# Patient Record
Sex: Female | Born: 1990 | Race: White | Hispanic: No | Marital: Single | Smoking: Current every day smoker
Health system: Southern US, Community
[De-identification: ages and names within clinical notes are randomized; demographics above are authoritative.]

## PROBLEM LIST (undated history)

## (undated) DIAGNOSIS — M329 Systemic lupus erythematosus, unspecified: Secondary | ICD-10-CM

## (undated) DIAGNOSIS — IMO0002 Reserved for concepts with insufficient information to code with codable children: Secondary | ICD-10-CM

## (undated) HISTORY — PX: CHOLECYSTECTOMY: SHX55

---

## 2015-03-07 ENCOUNTER — Encounter (HOSPITAL_COMMUNITY): Payer: Self-pay | Admitting: Emergency Medicine

## 2015-03-07 ENCOUNTER — Inpatient Hospital Stay (HOSPITAL_COMMUNITY)
Admission: EM | Admit: 2015-03-07 | Discharge: 2015-03-12 | DRG: 123 | Disposition: A | Discharge status: Home / Self Care | Payer: Self-pay | Attending: Internal Medicine | Admitting: Internal Medicine

## 2015-03-07 ENCOUNTER — Emergency Department (HOSPITAL_COMMUNITY): Payer: Self-pay

## 2015-03-07 DIAGNOSIS — H5441 Blindness, right eye, normal vision left eye: Secondary | ICD-10-CM | POA: Diagnosis present

## 2015-03-07 DIAGNOSIS — H469 Unspecified optic neuritis: Principal | ICD-10-CM | POA: Diagnosis present

## 2015-03-07 DIAGNOSIS — F17200 Nicotine dependence, unspecified, uncomplicated: Secondary | ICD-10-CM | POA: Diagnosis present

## 2015-03-07 DIAGNOSIS — M62838 Other muscle spasm: Secondary | ICD-10-CM | POA: Diagnosis present

## 2015-03-07 DIAGNOSIS — G43709 Chronic migraine without aura, not intractable, without status migrainosus: Secondary | ICD-10-CM | POA: Diagnosis present

## 2015-03-07 DIAGNOSIS — D869 Sarcoidosis, unspecified: Secondary | ICD-10-CM | POA: Diagnosis present

## 2015-03-07 DIAGNOSIS — H4710 Unspecified papilledema: Secondary | ICD-10-CM | POA: Diagnosis present

## 2015-03-07 DIAGNOSIS — H5461 Unqualified visual loss, right eye, normal vision left eye: Secondary | ICD-10-CM

## 2015-03-07 DIAGNOSIS — Z720 Tobacco use: Secondary | ICD-10-CM

## 2015-03-07 DIAGNOSIS — G43719 Chronic migraine without aura, intractable, without status migrainosus: Secondary | ICD-10-CM | POA: Insufficient documentation

## 2015-03-07 LAB — CBC
HEMATOCRIT: 48.9 % — AB (ref 36.0–46.0)
HEMOGLOBIN: 16 g/dL — AB (ref 12.0–15.0)
MCH: 30.9 pg (ref 26.0–34.0)
MCHC: 32.7 g/dL (ref 30.0–36.0)
MCV: 94.6 fL (ref 78.0–100.0)
PLATELETS: 179 10*3/uL (ref 150–400)
RBC: 5.17 MIL/uL — AB (ref 3.87–5.11)
RDW: 13 % (ref 11.5–15.5)
WBC: 6.8 10*3/uL (ref 4.0–10.5)

## 2015-03-07 LAB — BASIC METABOLIC PANEL
ANION GAP: 9 (ref 5–15)
BUN: 13 mg/dL (ref 6–20)
CO2: 22 mmol/L (ref 22–32)
Calcium: 9.3 mg/dL (ref 8.9–10.3)
Chloride: 107 mmol/L (ref 101–111)
Creatinine, Ser: 0.88 mg/dL (ref 0.44–1.00)
GLUCOSE: 102 mg/dL — AB (ref 65–99)
POTASSIUM: 4.3 mmol/L (ref 3.5–5.1)
Sodium: 138 mmol/L (ref 135–145)

## 2015-03-07 LAB — CREATININE, SERUM
Creatinine, Ser: 1.06 mg/dL — ABNORMAL HIGH (ref 0.44–1.00)
GFR calc non Af Amer: >60 mL/min (ref >60)

## 2015-03-07 LAB — CBC WITH DIFFERENTIAL/PLATELET
BASOS ABS: 0 10*3/uL (ref 0.0–0.1)
Basophils Relative: 0 %
Eosinophils Absolute: 0.2 10*3/uL (ref 0.0–0.7)
Eosinophils Relative: 2 %
HEMATOCRIT: 48.8 % — AB (ref 36.0–46.0)
HEMOGLOBIN: 16.4 g/dL — AB (ref 12.0–15.0)
LYMPHS PCT: 13 %
Lymphs Abs: 1.2 10*3/uL (ref 0.7–4.0)
MCH: 31.6 pg (ref 26.0–34.0)
MCHC: 33.6 g/dL (ref 30.0–36.0)
MCV: 94 fL (ref 78.0–100.0)
MONO ABS: 0.6 10*3/uL (ref 0.1–1.0)
MONOS PCT: 7 %
NEUTROS ABS: 7.3 10*3/uL (ref 1.7–7.7)
NEUTROS PCT: 78 %
Platelets: 249 10*3/uL (ref 150–400)
RBC: 5.19 MIL/uL — ABNORMAL HIGH (ref 3.87–5.11)
RDW: 13.3 % (ref 11.5–15.5)
WBC: 9.3 10*3/uL (ref 4.0–10.5)

## 2015-03-07 LAB — SEDIMENTATION RATE: Sed Rate: 4 mm/hr (ref 0–22)

## 2015-03-07 MED ORDER — SIMETHICONE 80 MG PO CHEW
80.0000 mg | CHEWABLE_TABLET | Freq: Four times a day (QID) | ORAL | Status: DC | PRN
  Administered 2015-03-07 – 2015-03-11 (×2): 80 mg via ORAL
  Filled 2015-03-07 (×2): qty 1

## 2015-03-07 MED ORDER — ENOXAPARIN SODIUM 40 MG/0.4ML ~~LOC~~ SOLN
40.0000 mg | SUBCUTANEOUS | Status: DC
  Administered 2015-03-07 – 2015-03-11 (×5): 40 mg via SUBCUTANEOUS
  Filled 2015-03-07 (×5): qty 0.4

## 2015-03-07 MED ORDER — SODIUM CHLORIDE 0.9 % IV SOLN
250.0000 mg | Freq: Four times a day (QID) | INTRAVENOUS | Status: AC
  Administered 2015-03-07 – 2015-03-12 (×20): 250 mg via INTRAVENOUS
  Filled 2015-03-07 (×24): qty 2

## 2015-03-07 MED ORDER — NICOTINE 21 MG/24HR TD PT24
21.0000 mg | MEDICATED_PATCH | Freq: Every day | TRANSDERMAL | Status: DC
  Administered 2015-03-07 – 2015-03-12 (×6): 21 mg via TRANSDERMAL
  Filled 2015-03-07 (×6): qty 1

## 2015-03-07 MED ORDER — LORAZEPAM 2 MG/ML IJ SOLN
1.0000 mg | Freq: Once | INTRAMUSCULAR | Status: AC
  Administered 2015-03-07: 1 mg via INTRAVENOUS
  Filled 2015-03-07: qty 1

## 2015-03-07 MED ORDER — OXYCODONE-ACETAMINOPHEN 5-325 MG PO TABS
1.0000 | ORAL_TABLET | Freq: Four times a day (QID) | ORAL | Status: DC | PRN
  Administered 2015-03-07 – 2015-03-09 (×5): 1 via ORAL
  Filled 2015-03-07 (×5): qty 1

## 2015-03-07 NOTE — Progress Notes (Signed)
Patient admitted to [facility] room 16 from [facility]  Admission made aware. Patient alert and oriented X4  Patient oriented to room and unit.

## 2015-03-07 NOTE — Consult Note (Addendum)
Ophthalmology Initial Consult Note  Jill, Ryan, 24 y.o. female Date of Service:  03/07/2015  Requesting physician: Gerhard Munch, MD  Information Obtained from: patient Chief Complaint:  Acute onset painless vision loss OD  HPI/Discussion:  Jill Ryan is a 24 y.o. female who presents with a 5-day history of painless progressive vision loss OD. It began with central blurriness, but then gradually the area of blurriness got progressively larger to the point where she was only able to detect motion. She has no pain at baseline, but she feels discomfort behind the eye (OD) when she moves it side to side. Unsure whether she had flashes/floaters/curtains. Incidentally, she has a history of prior peritoneal aneurysm. No complaints OS.  ROS(+): pain with eye movement laterally behind eye ROS(-): headache, neck pain,    Past Ocular Hx:  Denies Ocular Meds:  None Family ocular history: None  History reviewed. No pertinent past medical history. Past Surgical History  Procedure Laterality Date  . Cholecystectomy      Prior to Admission Meds:  (Not in a hospital admission)  Inpatient Meds: NA  No Known Allergies Social History  Substance Use Topics  . Smoking status: Current Every Day Smoker  . Smokeless tobacco: Not on file  . Alcohol Use: Yes   No family history on file.  ROS: Other than ROS in the HPI, all other systems were negative.  Exam: Temp: 98.2 F (36.8 C) Pulse Rate: 77 BP: 101/67 mmHg Resp: 18 SpO2: 99 %  Visual Acuity:  [name] (near)   OD HM 1 foot   OS 20/20     OD OS  Confr Vis Fields Grossly restricted Full  EOM (Primary) Full, no pain Full  Lids/Lashes WNL WNL  Conjunctiva - Bulbar White, quiet White, quiet  Conjunctiva - Palpebral               White, quiet White, quiet  Adnexa  WNL WNL  Pupils  4 --> 2, large rAPD (very sluggish by direct, brisk by consensual) 4 --> 2, no rAPD  Cornea  Clear Clear  Anterior Chamber Formed Formed   Lens:  Clear Clear  IOP 18 15  Fundus - Dilated? Yes   Optic Disc - C:D Ratio Small cup, difficult to determine 0.2                     Appearance  Significant disc edema with blurred margins 360 degrees, vessels still visible Pink, no edema  Post Seg:  Retina                    Vessels WNL WNL                  Vitreous  Clear Clear                  Macula Good foveal reflex Good foveal reflex                  Periphery WNL WNL       Neuro:  Oriented to person, place, and time:  Yes Psychiatric:  Mood and Affect Appropriate:  Yes  Labs/imaging:  MRI without contrast brain: WNL   A/P:  24 y.o. female with optic neuritis right eye  1) Optic neuritis, OD - Need to repeat MRI brain/orbits with contrast to r/o white matter disease. - Recommend neurology consult ASAP for possible admission and high-dose steroids. - Less likely would be neuroretinitis, as there is a good foveal  reflex and no macular edema or star.  Will reevaluate the patient in a few days if she is still admitted. Otherwise please have her f/u in clinic upon discharge for full repeat eye exam with visual field testing.  Baldwin [place] Scott Groat, MD 03/07/2015, 2:42 PM [facility] [facility] [address] [place], [place] [id number]

## 2015-03-07 NOTE — Progress Notes (Signed)
[facility] hospitalist progress note. Chief complaint. Transfer note. History of present illness. This 24 year old female [facility] complaining of blindness of the right eye. MRI of the brain found no acute changes. Ophthalmology consult. IV emergency room felt this was optic neuritis. [facility] for high-dose steroids and neurology consult. She is now arrived in transfer and I'm seeing her at bedside to ensure she remains clinically stable and that her orders have transferred here probably. Physical exam. Vital signs. Temperature 97.7, pulse 81, respiration 18, blood pressure 126/70. O2 sats 97. General appearance. Well-developed female who is alert and in no distress. Cardiac. Rate and rhythm regular. Lungs. Breath sounds clear and equal. Abdomen. Soft with positive bowel sounds. Neurologic. Other than left eye blindness no unilateral or focal defects noted. Impression/plan. Problem #1. Optic neuritis. Patient started on steroids in the emergency room. I will notify neurology of patient's arrival. Problem #2. Tobacco abuse. Smoking cessation with nicotine patch. Patient appears clinically stable post transfer. All orders appear to of transferred appropriately.

## 2015-03-07 NOTE — ED Notes (Signed)
Per pt, states she lost vision in right eye about 5 days ago-states symptoms have gotten worse-states she thought it was a cataract-states it makes he feel disoriented-states headache as well-pain behind right eye

## 2015-03-07 NOTE — ED Notes (Addendum)
Awake. Verbally responsive. A/O x4. Resp even and unlabored. No audible adventitious breath sounds noted. ABC's intact. Pt reported to continue to have visual disturbances to rt eye nausea, and tingling to nose. IV saline lock patent and intact. Family at bedside.

## 2015-03-07 NOTE — Consult Note (Signed)
NEURO HOSPITALIST CONSULT NOTE   Referring physician: [facility] Reason for Consult: new onset right vision loss  HPI:                                                                                                                                          Jill Ryan is an 24 y.o. female without relevant past medical history, transfer to [facility] or further evaluation of new onset right vision loss. Patient initially presented to [facility] with complains of acute, progressive visual loss right eye over the past 5 days and was evaluated by opthalmology. Never had similar symptom before. She tells me that she woke up one day with impaired vision in her right eye and some discomfort behind the right eye that becomes more ostensible when she moves her eyes. Stated that she is seeing " stuff that move nearby my right eye". Similarly, she reports a HA for the last couple of days but denies vertigo, double vision, focal weakness or numbness, unsteadiness, slurred speech, bladder or bowel dysfunction, or language impairment. No recent fever, infection, or skin rash. No visual trauma or family history of vision loss. MRI brain without contrast was personally reviewed and showed no acute abnormality.   History reviewed. No pertinent past medical history.  Past Surgical History  Procedure Laterality Date  . Cholecystectomy      History reviewed. No pertinent family history.  Family History: no hereditary neurophthalmological disorders, MS, brain tumors, or brain aneurysm   Social History:  reports that she has been smoking.  She does not have any smokeless tobacco history on file. She reports that she drinks alcohol. Her drug history is not on file.  No Known Allergies  MEDICATIONS:                                                                                                                     Scheduled: . enoxaparin (LOVENOX) injection  40 mg Subcutaneous Q24H  .  methylPREDNISolone (SOLU-MEDROL) injection  250 mg Intravenous 4 times per day  . nicotine  21 mg Transdermal Daily     ROS:  History obtained from chart review and the patient  General ROS: negative for - chills, fatigue, fever, night sweats, weight gain or weight loss Psychological ROS: negative for - behavioral disorder, hallucinations, memory difficulties, mood swings or suicidal ideation Ophthalmic ROS: negative for - blurry vision or double vision ENT ROS: negative for - epistaxis, nasal discharge, oral lesions, sore throat, tinnitus or vertigo Allergy and Immunology ROS: negative for - hives or itchy/watery eyes Hematological and Lymphatic ROS: negative for - bleeding problems, bruising or swollen lymph nodes Endocrine ROS: negative for - galactorrhea, hair pattern changes, polydipsia/polyuria or temperature intolerance Respiratory ROS: negative for - cough, hemoptysis, shortness of breath or wheezing Cardiovascular ROS: negative for - chest pain, dyspnea on exertion, edema or irregular heartbeat Gastrointestinal ROS: negative for - abdominal pain, diarrhea, hematemesis, nausea/vomiting or stool incontinence Genito-Urinary ROS: negative for - dysuria, hematuria, incontinence or urinary frequency/urgency Musculoskeletal ROS: negative for - joint swelling or muscular weakness Neurological ROS: as noted in HPI Dermatological ROS: negative for rash and skin lesion changes   Physical exam:  Constitutional: well developed, pleasant female in no apparent distress. Blood pressure 118/76, pulse 80, temperature 98 F (36.7 C), temperature source Oral, resp. rate 18, height '5\' 1"'  (1.549 m), weight 87.635 kg (193 lb 3.2 oz), SpO2 96 %. Eyes: no jaundice or exophthalmos.  Head: normocephalic. Neck: supple, no bruits, no JVD. Cardiac: no murmurs. Lungs:  clear. Abdomen: soft, no tender, no mass. Extremities: no edema, clubbing, or cyanosis.  Skin: no rash  Neurologic Examination:                                                                                                      General: NAD Mental Status: Alert, oriented, thought content appropriate.  Speech fluent without evidence of aphasia.  Able to follow 3 step commands without difficulty. Cranial Nerves: II: Discs flat bilaterally; Visual fields and visual acuity grossly restricted right eye, pupils equal, round, reactive to light but with a right APD. III,IV, VI: ptosis not present, extra-ocular motions intact bilaterally V,VII: smile symmetric, facial light touch sensation normal bilaterally VIII: hearing normal bilaterally IX,X: uvula rises symmetrically XI: bilateral shoulder shrug XII: midline tongue extension without atrophy or fasciculations  Motor: Right : Upper extremity   5/5    Left:     Upper extremity   5/5  Lower extremity   5/5     Lower extremity   5/5 Tone and bulk:normal tone throughout; no atrophy noted Sensory: Pinprick and light touch intact throughout, bilaterally Deep Tendon Reflexes:  Right: Upper Extremity   Left: Upper extremity   biceps (C-5 to C-6) 2/4   biceps (C-5 to C-6) 2/4 tricep (C7) 2/4    triceps (C7) 2/4 Brachioradialis (C6) 2/4  Brachioradialis (C6) 2/4  Lower Extremity Lower Extremity  quadriceps (L-2 to L-4) 2/4   quadriceps (L-2 to L-4) 2/4 Achilles (S1) 2/4   Achilles (S1) 2/4  Plantars: Right: downgoing   Left: downgoing Cerebellar: normal finger-to-nose,  normal heel-to-shin test Gait:  No ataxia    No results found for: CHOL  Results for orders placed or performed during the hospital encounter of 03/07/15 (from the past 48 hour(s))  Basic metabolic panel     Status: Abnormal   Collection Time: 03/07/15 11:08 AM  Result Value Ref Range   Sodium 138 135 - 145 mmol/L   Potassium 4.3 3.5 - 5.1 mmol/L   Chloride 107 101  - 111 mmol/L   CO2 22 22 - 32 mmol/L   Glucose, Bld 102 (H) 65 - 99 mg/dL   BUN 13 6 - 20 mg/dL   Creatinine, Ser 0.88 0.44 - 1.00 mg/dL   Calcium 9.3 8.9 - 10.3 mg/dL   GFR calc non Af Amer >60 >60 mL/min   GFR calc Af Amer >60 >60 mL/min    Comment: (NOTE) The eGFR has been calculated using the CKD EPI equation. This calculation has not been validated in all clinical situations. eGFR's persistently <60 mL/min signify possible Chronic Kidney Disease.    Anion gap 9 5 - 15  CBC with Differential     Status: Abnormal   Collection Time: 03/07/15 11:08 AM  Result Value Ref Range   WBC 9.3 4.0 - 10.5 K/uL   RBC 5.19 (H) 3.87 - 5.11 MIL/uL   Hemoglobin 16.4 (H) 12.0 - 15.0 g/dL   HCT 48.8 (H) 36.0 - 46.0 %   MCV 94.0 78.0 - 100.0 fL   MCH 31.6 26.0 - 34.0 pg   MCHC 33.6 30.0 - 36.0 g/dL   RDW 13.3 11.5 - 15.5 %   Platelets 249 150 - 400 K/uL   Neutrophils Relative % 78 %   Neutro Abs 7.3 1.7 - 7.7 K/uL   Lymphocytes Relative 13 %   Lymphs Abs 1.2 0.7 - 4.0 K/uL   Monocytes Relative 7 %   Monocytes Absolute 0.6 0.1 - 1.0 K/uL   Eosinophils Relative 2 %   Eosinophils Absolute 0.2 0.0 - 0.7 K/uL   Basophils Relative 0 %   Basophils Absolute 0.0 0.0 - 0.1 K/uL  Sedimentation rate     Status: None   Collection Time: 03/07/15 11:08 AM  Result Value Ref Range   Sed Rate 4 0 - 22 mm/hr  CBC     Status: Abnormal   Collection Time: 03/07/15  9:18 PM  Result Value Ref Range   WBC 6.8 4.0 - 10.5 K/uL   RBC 5.17 (H) 3.87 - 5.11 MIL/uL   Hemoglobin 16.0 (H) 12.0 - 15.0 g/dL   HCT 48.9 (H) 36.0 - 46.0 %   MCV 94.6 78.0 - 100.0 fL   MCH 30.9 26.0 - 34.0 pg   MCHC 32.7 30.0 - 36.0 g/dL   RDW 13.0 11.5 - 15.5 %   Platelets 179 150 - 400 K/uL  Creatinine, serum     Status: Abnormal   Collection Time: 03/07/15  9:18 PM  Result Value Ref Range   Creatinine, Ser 1.06 (H) 0.44 - 1.00 mg/dL   GFR calc non Af Amer >60 >60 mL/min   GFR calc Af Amer >60 >60 mL/min    Comment: (NOTE) The  eGFR has been calculated using the CKD EPI equation. This calculation has not been validated in all clinical situations. eGFR's persistently <60 mL/min signify possible Chronic Kidney Disease.     Mr Jodene Nam Head Wo Contrast  03/07/2015  CLINICAL DATA:  Acute RIGHT eye visual loss. This began 5 days ago. Headache behind RIGHT eye. EXAM: MRI HEAD WITHOUT CONTRAST MRA HEAD WITHOUT CONTRAST TECHNIQUE: Multiplanar, multiecho pulse sequences of the brain  and surrounding structures were obtained without intravenous contrast. Angiographic images of the head were obtained using MRA technique without contrast. COMPARISON:  None. FINDINGS: MRI HEAD FINDINGS No evidence for acute infarction, hemorrhage, mass lesion, hydrocephalus, or extra-axial fluid. There is no atrophy or white matter disease. Pineal and cerebellar tonsils unremarkable. No upper cervical lesions. Normal pituitary with no evidence for chiasmatic compression or suprasellar mass. Flow voids are maintained throughout the carotid, basilar, and vertebral arteries. There are no areas of chronic hemorrhage. Visualized calvarium, skull base, and upper cervical osseous structures unremarkable. Scalp and extracranial soft tissues, sinuses, and mastoids show no acute process. Negative appearing orbits. Symmetric globes with no retrobulbar lesions observed. MRA HEAD FINDINGS The internal carotid arteries are widely patent. The basilar artery is widely patent with vertebrals codominant. No intracranial stenosis or aneurysm. Patency of the BILATERAL ophthalmic arteries is established on axial source images. IMPRESSION: Negative noncontrast MRI of the brain and MRA intracranial. Electronically Signed   By: Staci Righter M.D.   On: 03/07/2015 12:10   [name] Contrast  03/07/2015  CLINICAL DATA:  Acute RIGHT eye visual loss. This began 5 days ago. Headache behind RIGHT eye. EXAM: MRI HEAD WITHOUT CONTRAST MRA HEAD WITHOUT CONTRAST TECHNIQUE: Multiplanar, multiecho  pulse sequences of the brain and surrounding structures were obtained without intravenous contrast. Angiographic images of the head were obtained using MRA technique without contrast. COMPARISON:  None. FINDINGS: MRI HEAD FINDINGS No evidence for acute infarction, hemorrhage, mass lesion, hydrocephalus, or extra-axial fluid. There is no atrophy or white matter disease. Pineal and cerebellar tonsils unremarkable. No upper cervical lesions. Normal pituitary with no evidence for chiasmatic compression or suprasellar mass. Flow voids are maintained throughout the carotid, basilar, and vertebral arteries. There are no areas of chronic hemorrhage. Visualized calvarium, skull base, and upper cervical osseous structures unremarkable. Scalp and extracranial soft tissues, sinuses, and mastoids show no acute process. Negative appearing orbits. Symmetric globes with no retrobulbar lesions observed. MRA HEAD FINDINGS The internal carotid arteries are widely patent. The basilar artery is widely patent with vertebrals codominant. No intracranial stenosis or aneurysm. Patency of the BILATERAL ophthalmic arteries is established on axial source images. IMPRESSION: Negative noncontrast MRI of the brain and MRA intracranial. Electronically Signed   By: Staci Righter M.D.   On: 03/07/2015 12:10   Assessment/Plan: 24 y/o otherwise healthy, admitted with likely right optic neuritis and less possible neuroretinitis.  MRI brain unrevealing but it was performed without contrast. Ordered MRI brain and orbits with and without contrast. Trial of high dose IV methylprednisolone. Check ACE level, ANA, Lyme titers, ESR, C reactive protein, HIV, B12. Will follow up.   Dorian Pod, MD 03/07/2015, 11:54 PM  [facility]

## 2015-03-07 NOTE — ED Notes (Signed)
Awake. Verbally responsive. A/O x4. Resp even and unlabored. No audible adventitious breath sounds noted. ABC's intact. IV saline lock patent and intact. 

## 2015-03-07 NOTE — ED Notes (Signed)
Patient returned from MRI without distress noted. 

## 2015-03-07 NOTE — ED Provider Notes (Signed)
CSN: [id number]     Arrival date & time 03/07/15  1005 History   First MD Initiated Contact with Patient 03/07/15 1020     Chief Complaint  Patient presents with  . vision changes      (Consider location/radiation/quality/duration/timing/severity/associated sxs/prior Treatment) HPI Patient presents with 5 days of admission loss. Symptoms were initially mild, with focal loss of vision in the central field of the right eye. Over the interval 5 days, the patient has developed a loss of vision in the right eye. She can now only see motion, changes in light pattern, with no preserved accuracy. There is no associated headache, neck pain, fever, chills, nausea, vomiting. 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 Since onset symptoms have been progressive, with no clear alleviating or exacerbating factors. History reviewed. No pertinent past medical history. Past Surgical History  Procedure Laterality Date  . Cholecystectomy     No family history on file. Social History  Substance Use Topics  . Smoking status: Current Every Day Smoker  . Smokeless tobacco: None  . Alcohol Use: Yes   OB History    No data available     Review of Systems  Constitutional:       Per HPI, otherwise negative  HENT:       Per HPI, otherwise negative  Eyes: Positive for visual disturbance. Negative for photophobia, pain, discharge, redness and itching.  Respiratory:       Per HPI, otherwise negative  Cardiovascular:       Per HPI, otherwise negative  Gastrointestinal: Negative for vomiting.  Endocrine:       Negative aside from HPI  Genitourinary:       Neg aside from HPI   Musculoskeletal:       Per HPI, otherwise negative  Skin: Negative.   Neurological: Negative for syncope.      Allergies  Review of patient's allergies indicates not on file.  Home Medications   Prior to Admission medications   Not on File   BP 135/98 mmHg   Pulse 94  Temp(Src) 98.1 F (36.7 C) (Oral)  Resp 18  SpO2 98% Physical Exam  Constitutional: She is oriented to person, place, and time. She appears well-developed and well-nourished. No distress.  HENT:  Head: Normocephalic and atraumatic.  Eyes: Conjunctivae and EOM are normal. Right eye exhibits no chemosis, no discharge and no exudate. No foreign body present in the right eye. Left eye exhibits no chemosis, no discharge and no exudate. No foreign body present in the left eye.  R eye afferent defect w slower and less pronounced constriction with light. Patient can only appreciate light differentiation, gross motion from the right eye. Formal eye testing demonstrates no capacity to see eye chart  Cardiovascular: Normal rate and regular rhythm.   Pulmonary/Chest: Effort normal and breath sounds normal. No stridor. No respiratory distress.  Abdominal: She exhibits no distension.  Musculoskeletal: She exhibits no edema.  Neurological: She is alert and oriented to person, place, and time. She displays no atrophy and no tremor. No cranial nerve deficit. She exhibits normal muscle tone. She displays no seizure activity. Coordination normal.  Skin: Skin is warm and dry.  Psychiatric: She has a normal mood and affect.  Nursing note and vitals reviewed.   ED Course  Procedures (including critical care time)   Immediate concern for new vision loss in a previously well 24 year old, particularly given her history of aneurysm, STAT MRI,  MRA was ordered.   Labs Review Labs Reviewed  BASIC METABOLIC PANEL - Abnormal; Notable for the following:    Glucose, Bld 102 (*)    All other components within normal limits  CBC WITH DIFFERENTIAL/PLATELET - Abnormal; Notable for the following:    RBC 5.19 (*)    Hemoglobin 16.4 (*)    HCT 48.8 (*)    All other components within normal limits  SEDIMENTATION RATE    On repeat exam patient continues to have no vision in the right eye  Imaging  Review [name] Contrast  03/07/2015  CLINICAL DATA:  Acute RIGHT eye visual loss. This began 5 days ago. Headache behind RIGHT eye. EXAM: MRI HEAD WITHOUT CONTRAST MRA HEAD WITHOUT CONTRAST TECHNIQUE: Multiplanar, multiecho pulse sequences of the brain and surrounding structures were obtained without intravenous contrast. Angiographic images of the head were obtained using MRA technique without contrast. COMPARISON:  None. FINDINGS: MRI HEAD FINDINGS No evidence for acute infarction, hemorrhage, mass lesion, hydrocephalus, or extra-axial fluid. There is no atrophy or white matter disease. Pineal and cerebellar tonsils unremarkable. No upper cervical lesions. Normal pituitary with no evidence for chiasmatic compression or suprasellar mass. Flow voids are maintained throughout the carotid, basilar, and vertebral arteries. There are no areas of chronic hemorrhage. Visualized calvarium, skull base, and upper cervical osseous structures unremarkable. Scalp and extracranial soft tissues, sinuses, and mastoids show no acute process. Negative appearing orbits. Symmetric globes with no retrobulbar lesions observed. MRA HEAD FINDINGS The internal carotid arteries are widely patent. The basilar artery is widely patent with vertebrals codominant. No intracranial stenosis or aneurysm. Patency of the BILATERAL ophthalmic arteries is established on axial source images. IMPRESSION: Negative noncontrast MRI of the brain and MRA intracranial. Electronically Signed   By: Elsie Stain M.D.   On: 03/07/2015 12:10   [name] Contrast  03/07/2015  CLINICAL DATA:  Acute RIGHT eye visual loss. This began 5 days ago. Headache behind RIGHT eye. EXAM: MRI HEAD WITHOUT CONTRAST MRA HEAD WITHOUT CONTRAST TECHNIQUE: Multiplanar, multiecho pulse sequences of the brain and surrounding structures were obtained without intravenous contrast. Angiographic images of the head were obtained using MRA technique without contrast. COMPARISON:   None. FINDINGS: MRI HEAD FINDINGS No evidence for acute infarction, hemorrhage, mass lesion, hydrocephalus, or extra-axial fluid. There is no atrophy or white matter disease. Pineal and cerebellar tonsils unremarkable. No upper cervical lesions. Normal pituitary with no evidence for chiasmatic compression or suprasellar mass. Flow voids are maintained throughout the carotid, basilar, and vertebral arteries. There are no areas of chronic hemorrhage. Visualized calvarium, skull base, and upper cervical osseous structures unremarkable. Scalp and extracranial soft tissues, sinuses, and mastoids show no acute process. Negative appearing orbits. Symmetric globes with no retrobulbar lesions observed. MRA HEAD FINDINGS The internal carotid arteries are widely patent. The basilar artery is widely patent with vertebrals codominant. No intracranial stenosis or aneurysm. Patency of the BILATERAL ophthalmic arteries is established on axial source images. IMPRESSION: Negative noncontrast MRI of the brain and MRA intracranial. Electronically Signed   By: Elsie Stain M.D.   On: 03/07/2015 12:10   I have personally reviewed and evaluated these images and lab results as part of my medical decision-making.  After my initial evaluation with visual loss discussed patient's case with our ophthalmology colleagues. We discussed patient's MRI results, and patient had stat ophthalmology consult in the emergency department. We reviewed the imaging of the dilated exam, consistent with optic neuritis: Blurred optic disc margins,  minimally appreciable cup on the right, essentially normal findings on the left.  With concern for optic neuritis, the patient will start steroids. I discussed the case with our neurology colleagues, hospitalist colleagues for admission, transfer.   MDM   Final diagnoses:  Vision loss of right eye   24 year old female presents with new also vision in the right eye. Patient was well prior to the  onset of symptoms, and new vision loss was immediately evaluated with MRI, emergent ophthalmology consult. Findings consistent with optic neuritis. Patient started on high-dose steroids, admitted for further evaluation, management.   CRITICAL CARE Performed by: Gerhard Munch Total critical care time: 45 minutes Critical care time was exclusive of separately billable procedures and treating other patients. Critical care was necessary to treat or prevent imminent or life-threatening deterioration. Critical care was time spent personally by me on the following activities: development of treatment plan with patient and/or surrogate as well as nursing, discussions with consultants, evaluation of patient's response to treatment, examination of patient, obtaining history from patient or surrogate, ordering and performing treatments and interventions, ordering and review of laboratory studies, ordering and review of radiographic studies, pulse oximetry and re-evaluation of patient's condition.   Gerhard Munch, MD 03/07/15 [id number]

## 2015-03-07 NOTE — ED Notes (Signed)
Awake. Verbally responsive. A/O x4. Resp even and unlabored. No audible adventitious breath sounds noted. ABC's intact. IV saline lock patent and intact. Family at bedside. 

## 2015-03-07 NOTE — ED Notes (Signed)
Awake. Verbally responsive. A/O x4. Resp even and unlabored. No audible adventitious breath sounds noted. ABC's intact.  

## 2015-03-07 NOTE — H&P (Signed)
History and Physical  Jill Ryan ZOX:[id number] DOB: 1990/12/01 DOA: 03/07/2015  Referring physician: EDP PCP: No primary care provider on file.   Chief Complaint: blindness right eye  HPI: Jill Ryan is a 24 y.o. female   With no significant past medical history , except s/p cholecystectomy in 04/2014, presented to [facility] ED due to blindness right eye, she reported it started 5days ago with loss of central vision, then symptom progressed to where she could only see shadows . She also reported some eye pain and some headache, no redness, no secretion, no fever, no weakness, no chest pain.  Ed course: vital stable, MRI brain no acute findings. Basic labs showed hemoglobin of 16.4, otherwise unremarkable. Opthalmology consulted  By EDP, who think this is optic neuritis, recommended high dose steroids and neurology consult, neurology recommended patient to be admitted to [facility].  Review of Systems:  Detail per HPI, Review of systems are otherwise negative  History reviewed. No pertinent past medical history. Past Surgical History  Procedure Laterality Date  . Cholecystectomy     Social History:  reports that she has been smoking.  She does not have any smokeless tobacco history on file. She reports that she drinks alcohol. Her drug history is not on file. Patient lives at home & is able to participate in activities of daily living independently   No Known Allergies  No family history on file.    Prior to Admission medications   Medication Sig Start Date End Date Taking? Authorizing Provider  Acetaminophen (TYLENOL PO) Take 1 tablet by mouth once. Tylenol tension   Yes Historical Provider, MD    Physical Exam: BP 100/87 mmHg  Pulse 86  Temp(Src) 98.2 F (36.8 C) (Temporal)  Resp 18  SpO2 98%  General:  NAD Eyes: PERRL ENT: only able to see motions from right eye,  Neck: supple, no JVD Cardiovascular: RRR Respiratory: CTABL Abdomen: soft/ND/ND, positive bowel  sounds Skin: no rash Musculoskeletal:  No edema Psychiatric: calm/cooperative Neurologic: right eye decreased vision          Labs on Admission:  Basic Metabolic Panel:  Recent Labs Lab 03/07/15 1108  NA 138  K 4.3  CL 107  CO2 22  GLUCOSE 102*  BUN 13  CREATININE 0.88  CALCIUM 9.3   Liver Function Tests: No results for input(s): AST, ALT, ALKPHOS, BILITOT, PROT, ALBUMIN in the last 168 hours. No results for input(s): LIPASE, AMYLASE in the last 168 hours. No results for input(s): AMMONIA in the last 168 hours. CBC:  Recent Labs Lab 03/07/15 1108  WBC 9.3  NEUTROABS 7.3  HGB 16.4*  HCT 48.8*  MCV 94.0  PLT 249   Cardiac Enzymes: No results for input(s): CKTOTAL, CKMB, CKMBINDEX, TROPONINI in the last 168 hours.  BNP (last 3 results) No results for input(s): BNP in the last 8760 hours.  ProBNP (last 3 results) No results for input(s): PROBNP in the last 8760 hours.  CBG: No results for input(s): GLUCAP in the last 168 hours.  Radiological Exams on Admission: [name] Contrast  03/07/2015  CLINICAL DATA:  Acute RIGHT eye visual loss. This began 5 days ago. Headache behind RIGHT eye. EXAM: MRI HEAD WITHOUT CONTRAST MRA HEAD WITHOUT CONTRAST TECHNIQUE: Multiplanar, multiecho pulse sequences of the brain and surrounding structures were obtained without intravenous contrast. Angiographic images of the head were obtained using MRA technique without contrast. COMPARISON:  None. FINDINGS: MRI HEAD FINDINGS No evidence for acute infarction, hemorrhage, mass lesion,  hydrocephalus, or extra-axial fluid. There is no atrophy or white matter disease. Pineal and cerebellar tonsils unremarkable. No upper cervical lesions. Normal pituitary with no evidence for chiasmatic compression or suprasellar mass. Flow voids are maintained throughout the carotid, basilar, and vertebral arteries. There are no areas of chronic hemorrhage. Visualized calvarium, skull base, and upper  cervical osseous structures unremarkable. Scalp and extracranial soft tissues, sinuses, and mastoids show no acute process. Negative appearing orbits. Symmetric globes with no retrobulbar lesions observed. MRA HEAD FINDINGS The internal carotid arteries are widely patent. The basilar artery is widely patent with vertebrals codominant. No intracranial stenosis or aneurysm. Patency of the BILATERAL ophthalmic arteries is established on axial source images. IMPRESSION: Negative noncontrast MRI of the brain and MRA intracranial. Electronically Signed   By: Elsie StainJohn T Curnes M.D.   On: 03/07/2015 12:10   [name] Contrast  03/07/2015  CLINICAL DATA:  Acute RIGHT eye visual loss. This began 5 days ago. Headache behind RIGHT eye. EXAM: MRI HEAD WITHOUT CONTRAST MRA HEAD WITHOUT CONTRAST TECHNIQUE: Multiplanar, multiecho pulse sequences of the brain and surrounding structures were obtained without intravenous contrast. Angiographic images of the head were obtained using MRA technique without contrast. COMPARISON:  None. FINDINGS: MRI HEAD FINDINGS No evidence for acute infarction, hemorrhage, mass lesion, hydrocephalus, or extra-axial fluid. There is no atrophy or white matter disease. Pineal and cerebellar tonsils unremarkable. No upper cervical lesions. Normal pituitary with no evidence for chiasmatic compression or suprasellar mass. Flow voids are maintained throughout the carotid, basilar, and vertebral arteries. There are no areas of chronic hemorrhage. Visualized calvarium, skull base, and upper cervical osseous structures unremarkable. Scalp and extracranial soft tissues, sinuses, and mastoids show no acute process. Negative appearing orbits. Symmetric globes with no retrobulbar lesions observed. MRA HEAD FINDINGS The internal carotid arteries are widely patent. The basilar artery is widely patent with vertebrals codominant. No intracranial stenosis or aneurysm. Patency of the BILATERAL ophthalmic arteries is  established on axial source images. IMPRESSION: Negative noncontrast MRI of the brain and MRA intracranial. Electronically Signed   By: Elsie StainJohn T Curnes M.D.   On: 03/07/2015 12:10      Assessment/Plan Present on Admission:  **None**  Optic neuritis: steroids started in the ED, admit to [facility],  neurology and ophthalmology  Following.  Smoking: cessation education provided, agreed to nicotine patch.   DVT prophylaxis: lovenox  Consultants:  Neurology ophthalmology  Code Status: full   Family Communication:  Patient   Disposition Plan: admit to [place] [facility]  Time spent: 60mins, more than 50% of time spent on coordination of care.  Estiven Kohan MD, PhD [facility] Pager [phone] If 7PM-7AM, please contact night-coverage at [email or web address], password [email or web address]

## 2015-03-07 NOTE — ED Notes (Addendum)
Awake. Verbally responsive. A/O x4. Resp even and unlabored. No audible adventitious breath sounds noted. ABC's intact. IV saline lock patent and intact. Pt ambulated to BR with steady gait. 

## 2015-03-08 ENCOUNTER — Inpatient Hospital Stay (HOSPITAL_COMMUNITY): Payer: Self-pay

## 2015-03-08 DIAGNOSIS — G43719 Chronic migraine without aura, intractable, without status migrainosus: Secondary | ICD-10-CM | POA: Insufficient documentation

## 2015-03-08 DIAGNOSIS — M62838 Other muscle spasm: Secondary | ICD-10-CM | POA: Insufficient documentation

## 2015-03-08 DIAGNOSIS — H5461 Unqualified visual loss, right eye, normal vision left eye: Secondary | ICD-10-CM | POA: Insufficient documentation

## 2015-03-08 LAB — CBC
HEMATOCRIT: 47.5 % — AB (ref 36.0–46.0)
Hemoglobin: 16.5 g/dL — ABNORMAL HIGH (ref 12.0–15.0)
MCH: 32.2 pg (ref 26.0–34.0)
MCHC: 34.7 g/dL (ref 30.0–36.0)
MCV: 92.6 fL (ref 78.0–100.0)
Platelets: 165 10*3/uL (ref 150–400)
RBC: 5.13 MIL/uL — ABNORMAL HIGH (ref 3.87–5.11)
RDW: 12.8 % (ref 11.5–15.5)
WBC: 6.8 10*3/uL (ref 4.0–10.5)

## 2015-03-08 LAB — COMPREHENSIVE METABOLIC PANEL
ALBUMIN: 3.8 g/dL (ref 3.5–5.0)
ALT: 19 U/L (ref 14–54)
AST: 17 U/L (ref 15–41)
Alkaline Phosphatase: 90 U/L (ref 38–126)
Anion gap: 10 (ref 5–15)
BUN: 8 mg/dL (ref 6–20)
CHLORIDE: 104 mmol/L (ref 101–111)
CO2: 21 mmol/L — AB (ref 22–32)
Calcium: 9.5 mg/dL (ref 8.9–10.3)
Creatinine, Ser: 0.71 mg/dL (ref 0.44–1.00)
GFR calc Af Amer: >60 mL/min (ref >60)
GLUCOSE: 155 mg/dL — AB (ref 65–99)
POTASSIUM: 4.2 mmol/L (ref 3.5–5.1)
SODIUM: 135 mmol/L (ref 135–145)
Total Bilirubin: 1 mg/dL (ref 0.3–1.2)
Total Protein: 6.9 g/dL (ref 6.5–8.1)

## 2015-03-08 LAB — SEDIMENTATION RATE: SED RATE: 3 mm/h (ref 0–22)

## 2015-03-08 LAB — TSH: TSH: 0.365 u[IU]/mL (ref 0.350–4.500)

## 2015-03-08 LAB — HIV ANTIBODY (ROUTINE TESTING W REFLEX): HIV SCREEN 4TH GENERATION: NONREACTIVE

## 2015-03-08 LAB — VITAMIN B12: VITAMIN B 12: 316 pg/mL (ref 180–914)

## 2015-03-08 LAB — C-REACTIVE PROTEIN: CRP: 0.6 mg/dL (ref <1.0)

## 2015-03-08 MED ORDER — DEXTROSE 5 % IV SOLN
125.0000 mg | Freq: Four times a day (QID) | INTRAVENOUS | Status: DC
  Administered 2015-03-08 – 2015-03-10 (×6): 125 mg via INTRAVENOUS
  Filled 2015-03-08 (×8): qty 1.25

## 2015-03-08 MED ORDER — GADOBENATE DIMEGLUMINE 529 MG/ML IV SOLN
20.0000 mL | Freq: Once | INTRAVENOUS | Status: AC | PRN
  Administered 2015-03-08: 18 mL via INTRAVENOUS

## 2015-03-08 MED ORDER — FAMOTIDINE 20 MG PO TABS
20.0000 mg | ORAL_TABLET | Freq: Two times a day (BID) | ORAL | Status: DC
  Administered 2015-03-08 – 2015-03-12 (×8): 20 mg via ORAL
  Filled 2015-03-08 (×8): qty 1

## 2015-03-08 MED ORDER — BACLOFEN 10 MG PO TABS
5.0000 mg | ORAL_TABLET | Freq: Two times a day (BID) | ORAL | Status: DC
  Administered 2015-03-09 – 2015-03-12 (×8): 5 mg via ORAL
  Filled 2015-03-08 (×8): qty 1

## 2015-03-08 MED ORDER — BACLOFEN 10 MG PO TABS
10.0000 mg | ORAL_TABLET | Freq: Every day | ORAL | Status: DC
  Administered 2015-03-08 – 2015-03-11 (×4): 10 mg via ORAL
  Filled 2015-03-08 (×4): qty 1

## 2015-03-08 MED ORDER — LORAZEPAM 2 MG/ML IJ SOLN
0.5000 mg | Freq: Once | INTRAMUSCULAR | Status: AC
  Administered 2015-03-08: 0.5 mg via INTRAVENOUS
  Filled 2015-03-08: qty 1

## 2015-03-08 MED ORDER — ZOLPIDEM TARTRATE 5 MG PO TABS
5.0000 mg | ORAL_TABLET | Freq: Every day | ORAL | Status: DC
  Administered 2015-03-08 – 2015-03-11 (×4): 5 mg via ORAL
  Filled 2015-03-08 (×4): qty 1

## 2015-03-08 NOTE — Progress Notes (Signed)
Utilization Review Completed.[facility], Duyen T12/07/2014  

## 2015-03-08 NOTE — Progress Notes (Signed)
Ophthalmology Progress Note  Kerin,Sueann, 24 y.o. female Date of Service:  03/08/2015  Attending physician: Cathren Harshipudeep K Rai, MD  Information Obtained from: patient Chief Complaint:  Optic neuritis  HPI/Discussion: Renford DillsDeborah Cardella is a 24 y.o. female who presented with a 5-day history of progressive vision loss OD. It began with central blurriness, but then gradually the area of blurriness got progressively larger to the point where she was only able to detect motion. She has no pain at baseline, but she feels discomfort behind the eye (OD) when she moves it side to side. Unsure whether she had flashes/floaters/curtains. Incidentally, she has a history of prior peritoneal aneurysm. No complaints OS.  Interval history: MRI with contrast also suggestive of optic neuritis, no other white matter lesions. Pt has been started on IV methylprednisolone. No subjective improvement in [place]. No new complaints.   Inpatient Meds: . enoxaparin (LOVENOX) injection  40 mg Subcutaneous Q24H  . methylPREDNISolone (SOLU-MEDROL) injection  250 mg Intravenous 4 times per day  . nicotine  21 mg Transdermal Daily  . zolpidem  5 mg Oral QHS    Exam: Filed Vitals:   03/08/15 0000 03/08/15 1353  BP: 119/72 112/65  Pulse: 76 80  Temp: 98.6 F (37 C) 98.2 F (36.8 C)  Resp: 18 16     Visual Acuity:  Tatamy   OD HM 1'   OS 20/20     OD OS  EOM (Primary) Full (pain improved) Full  Lids/Lashes WNL WNL  Conjunctiva White, quiet White, quiet  Adnexa  WNL WNL  Pupils  4 --> 2, large rAPD (unchanged) 4 --> 2, no rAPD  Cornea  Clear Clear  Anterior Chamber Formed Formed  Lens:  Clear Clear  IOP Normal to digital palpation Normal to digital palpation  Fundus - Dilated? NO   Optic Disc - C:D Ratio Small, unchanged edema 360 degrees with blurred margins but vessels remaining visible, no heme Not examined  Post Seg:  Retina                    Vessels WNL Not examined                  Vitreous  Clear Not  examined                  Macula Good foveal reflex, no macular star Not examined                  Periphery Not examined Not examined       Neuro:  Oriented to person, place, and time:  Yes Psychiatric:  Mood and Affect Appropriate:  Yes  Labs/imaging:  MRI brain/orbits with contrast: Disc edema OD with hyperintense signal right optic nerve   A/P:  1) Optic neuritis, OD - Confirmed hyperintensity with edema on MRI with contrast. - Disc edema persists on fundus exam today, essentially unchanged. - No improvement in vision since starting IV methylprednisolone 250 mg QID (continue per neurology recs) - difficult to assess ultimate visual prognosis. - No evidence of other white matter disease. Risk of subsequent development of MS explained to the patient. - She will likely need ongoing care or monitoring through neurology department upon discharge - appreciate their assistance.  Will plan on reevaluating her in 2-3 days in-house. Please notify me if there are any changes or concerns before then.  Baldwin [place] Scott Kona Lover, MD 03/08/2015, 3:36 PM [address] [address] [place], [place]  [address]  

## 2015-03-08 NOTE — Progress Notes (Signed)
Subjective: No significant change in her right eye vision loss . she she can see only some shadows or movements. Otherwise no other new neurological symptoms. Tolerating IV Solu-Medrol without any problems.  She has chronic daily headaches with migraines for several weeks which have been intractable. Has not used any prophylactic medications. She reported being given prescriptions for Percocet 7 the past for migraines. MRI of the orbits reviewed showing enhancing right optic nerve with edema suggestive of acute right optic neuritis.   Objective: Current vital signs: BP 111/61 mmHg  Pulse 76  Temp(Src) 98.4 F (36.9 C) (Oral)  Resp 16  Ht 5\' 1"  (1.549 m)  Wt 87.635 kg (193 lb 3.2 oz)  BMI 36.52 kg/m2  SpO2 98% Vital signs in last 24 hours: Temp:  [98.2 F (36.8 C)-98.6 F (37 C)] 98.4 F (36.9 C) (12/04 2154) Pulse Rate:  [76-80] 76 (12/04 2154) Resp:  [16-18] 16 (12/04 2154) BP: (111-119)/(61-72) 111/61 mmHg (12/04 2154) SpO2:  [95 %-98 %] 98 % (12/04 2154)     Nutritional status: Diet regular Room service appropriate?: Yes; Fluid consistency:: Thin  Neurologic Exam: Alert, oriented 4, fluent speech no dysarthria or aphasia. Severely reduced visualized in the right eye, limited to hand movements at 1 feet, rest of the cranial nerve examination intact. Full motor strength.    Lab Results: Basic Metabolic Panel:  Recent Labs Lab 03/07/15 1108 03/07/15 2118 03/08/15 0540  NA 138  --  135  K 4.3  --  4.2  CL 107  --  104  CO2 22  --  21*  GLUCOSE 102*  --  155*  BUN 13  --  8  CREATININE 0.88 1.06* 0.71  CALCIUM 9.3  --  9.5    Liver Function Tests:  Recent Labs Lab 03/08/15 0540  AST 17  ALT 19  ALKPHOS 90  BILITOT 1.0  PROT 6.9  ALBUMIN 3.8   No results for input(s): LIPASE, AMYLASE in the last 168 hours. No results for input(s): AMMONIA in the last 168 hours.  CBC:  Recent Labs Lab 03/07/15 1108 03/07/15 2118 03/08/15 0540  WBC 9.3 6.8 6.8   NEUTROABS 7.3  --   --   HGB 16.4* 16.0* 16.5*  HCT 48.8* 48.9* 47.5*  MCV 94.0 94.6 92.6  PLT 249 179 165    Cardiac Enzymes: No results for input(s): CKTOTAL, CKMB, CKMBINDEX, TROPONINI in the last 168 hours.  Lipid Panel: No results for input(s): CHOL, TRIG, HDL, CHOLHDL, VLDL, LDLCALC in the last 168 hours.  CBG: No results for input(s): GLUCAP in the last 168 hours.  Microbiology: No results found for this or any previous visit.  Coagulation Studies: No results for input(s): LABPROT, INR in the last 72 hours.  Imaging: Mr Shirlee LatchMra Head WGWo Contrast  03/07/2015  CLINICAL DATA:  Acute RIGHT eye visual loss. This began 5 days ago. Headache behind RIGHT eye. EXAM: MRI HEAD WITHOUT CONTRAST MRA HEAD WITHOUT CONTRAST TECHNIQUE: Multiplanar, multiecho pulse sequences of the brain and surrounding structures were obtained without intravenous contrast. Angiographic images of the head were obtained using MRA technique without contrast. COMPARISON:  None. FINDINGS: MRI HEAD FINDINGS No evidence for acute infarction, hemorrhage, mass lesion, hydrocephalus, or extra-axial fluid. There is no atrophy or white matter disease. Pineal and cerebellar tonsils unremarkable. No upper cervical lesions. Normal pituitary with no evidence for chiasmatic compression or suprasellar mass. Flow voids are maintained throughout the carotid, basilar, and vertebral arteries. There are no areas of chronic hemorrhage.  Visualized calvarium, skull base, and upper cervical osseous structures unremarkable. Scalp and extracranial soft tissues, sinuses, and mastoids show no acute process. Negative appearing orbits. Symmetric globes with no retrobulbar lesions observed. MRA HEAD FINDINGS The internal carotid arteries are widely patent. The basilar artery is widely patent with vertebrals codominant. No intracranial stenosis or aneurysm. Patency of the BILATERAL ophthalmic arteries is established on axial source images. IMPRESSION:  Negative noncontrast MRI of the brain and MRA intracranial. Electronically Signed   By: Elsie Stain M.D.   On: 03/07/2015 12:10   [name] Contrast  03/07/2015  CLINICAL DATA:  Acute RIGHT eye visual loss. This began 5 days ago. Headache behind RIGHT eye. EXAM: MRI HEAD WITHOUT CONTRAST MRA HEAD WITHOUT CONTRAST TECHNIQUE: Multiplanar, multiecho pulse sequences of the brain and surrounding structures were obtained without intravenous contrast. Angiographic images of the head were obtained using MRA technique without contrast. COMPARISON:  None. FINDINGS: MRI HEAD FINDINGS No evidence for acute infarction, hemorrhage, mass lesion, hydrocephalus, or extra-axial fluid. There is no atrophy or white matter disease. Pineal and cerebellar tonsils unremarkable. No upper cervical lesions. Normal pituitary with no evidence for chiasmatic compression or suprasellar mass. Flow voids are maintained throughout the carotid, basilar, and vertebral arteries. There are no areas of chronic hemorrhage. Visualized calvarium, skull base, and upper cervical osseous structures unremarkable. Scalp and extracranial soft tissues, sinuses, and mastoids show no acute process. Negative appearing orbits. Symmetric globes with no retrobulbar lesions observed. MRA HEAD FINDINGS The internal carotid arteries are widely patent. The basilar artery is widely patent with vertebrals codominant. No intracranial stenosis or aneurysm. Patency of the BILATERAL ophthalmic arteries is established on axial source images. IMPRESSION: Negative noncontrast MRI of the brain and MRA intracranial. Electronically Signed   By: Elsie Stain M.D.   On: 03/07/2015 12:10   Mr Laqueta Jean Contrast  03/08/2015  CLINICAL DATA:  Progressive RIGHT eye vision loss for 5 days. EXAM: MRI HEAD WITH CONTRAST MR ORBITS WITHOUT AND WITH CONTRAST TECHNIQUE: Multiplanar, multiecho pulse sequences of the brain and surrounding structures were obtained with intravenous contrast.  Multiplanar, multiecho pulse sequences of the orbits and surrounding structures were obtained including fat saturation techniques, before and after intravenous contrast administration. CONTRAST:  18mL MULTIHANCE GADOBENATE DIMEGLUMINE 529 MG/ML IV SOLN COMPARISON:  MRI of the head March 07, 2015 FINDINGS: MRI HEAD FINDINGS No abnormal intracranial enhancement. Ventricles and sulci remain normal for patient's age. No midline shift or mass effect. No enhancing mass lesions. No abnormal extra-axial fluid collections or suspicious extra-axial enhancement. MRI ORBITS FINDINGS Ocular globes intact, lenses are located. Move T2 bright signal within the RIGHT optic nerve, effacing the cerebral spinal fluid space of the optic nerve sleeves. Very faint associated RIGHT optic nerve enhancement. Normal appearance optic chiasm and radiations. No orbital mass. Normal appearance of LEFT optic nerve and nerve sheath complex. Normal appearance of the extra-ocular muscles. Preservation orbital fat. No abnormal bone marrow signal. Minimal paranasal sinus mucosal thickening without air-fluid levels. IMPRESSION: RIGHT optic nerve edema and enhancement compatible with optic neuritis. No abnormal intracranial enhancement. Electronically Signed   By: Awilda Metro M.D.   On: 03/08/2015 05:39   Mr Birdie Hopes Wo/w Cm  03/08/2015  CLINICAL DATA:  Progressive RIGHT eye vision loss for 5 days. EXAM: MRI HEAD WITH CONTRAST MR ORBITS WITHOUT AND WITH CONTRAST TECHNIQUE: Multiplanar, multiecho pulse sequences of the brain and surrounding structures were obtained with intravenous contrast. Multiplanar, multiecho pulse sequences of the orbits and surrounding structures  were obtained including fat saturation techniques, before and after intravenous contrast administration. CONTRAST:  18mL MULTIHANCE GADOBENATE DIMEGLUMINE 529 MG/ML IV SOLN COMPARISON:  MRI of the head March 07, 2015 FINDINGS: MRI HEAD FINDINGS No abnormal intracranial  enhancement. Ventricles and sulci remain normal for patient's age. No midline shift or mass effect. No enhancing mass lesions. No abnormal extra-axial fluid collections or suspicious extra-axial enhancement. MRI ORBITS FINDINGS Ocular globes intact, lenses are located. Move T2 bright signal within the RIGHT optic nerve, effacing the cerebral spinal fluid space of the optic nerve sleeves. Very faint associated RIGHT optic nerve enhancement. Normal appearance optic chiasm and radiations. No orbital mass. Normal appearance of LEFT optic nerve and nerve sheath complex. Normal appearance of the extra-ocular muscles. Preservation orbital fat. No abnormal bone marrow signal. Minimal paranasal sinus mucosal thickening without air-fluid levels. IMPRESSION: RIGHT optic nerve edema and enhancement compatible with optic neuritis. No abnormal intracranial enhancement. Electronically Signed   By: Awilda Metro M.D.   On: 03/08/2015 05:39    Medications:  Current facility-administered medications:  .  baclofen (LIORESAL) tablet 10 mg, 10 mg, Oral, Daily, Arcenia Scarbro Daniel Nones, MD, 10 mg at 03/08/15 2029 .  [START ON 03/09/2015] baclofen (LIORESAL) tablet 5 mg, 5 mg, Oral, BID, Aquan Kope Daniel Nones, MD .  enoxaparin (LOVENOX) injection 40 mg, 40 mg, Subcutaneous, Q24H, Albertine Grates, MD, 40 mg at 03/08/15 2059 .  methylPREDNISolone sodium succinate (SOLU-MEDROL) 250 mg in sodium chloride 0.9 % 50 mL IVPB, 250 mg, Intravenous, 4 times per day, Gerhard Munch, MD, 250 mg at 03/08/15 1823 .  nicotine (NICODERM CQ - dosed in mg/24 hours) patch 21 mg, 21 mg, Transdermal, Daily, Albertine Grates, MD, 21 mg at 03/08/15 1121 .  oxyCODONE-acetaminophen (PERCOCET/ROXICET) 5-325 MG per tablet 1 tablet, 1 tablet, Oral, Q6H PRN, Richarda Overlie, MD, 1 tablet at 03/08/15 2110 .  simethicone (MYLICON) chewable tablet 80 mg, 80 mg, Oral, QID PRN, Richarda Overlie, MD, 80 mg at 03/07/15 2331 .  valproate (DEPACON) 125 mg in dextrose 5 % 50  mL IVPB, 125 mg, Intravenous, 4 times per day, Zackarie Chason Daniel Nones, MD, 125 mg at 03/08/15 2100 .  zolpidem (AMBIEN) tablet 5 mg, 5 mg, Oral, QHS, Ripudeep K Rai, MD, 5 mg at 03/08/15 2101   Assessment/Plan:  24 year old female patient with severe right optic neuritis, with abnormal MRI imaging appearance of right optic nerve swelling and enhancement. Given the severity of vision loss with the edematous changes seen on the MRI involving the right optic nerve, the prognosis for the vision recovery is guarded. She is currently on IV Solu-Medrol to 50 mg every 6 hours, due to. A total of 5 days of therapy is recommended. Although her brain MRI does not show any parenchymal lesions, optic neuritis secondary to MS is very high in the differential. She is advised to follow-up with outpatient neurology MS specialist, for continued monitoring and to possibly start a disease modifying therapy for MS when the diagnosis confirmed.   She has intractable daily headaches for several weeks. Recommend starting IV Depacon 125 g every 6 hours, and baclofen 5 mg after breakfast and lunch, and 10 mg after dinner to help with these headaches and neck pain. May switch IV Depacon to by mouth Depakote 250 mg twice a day if her headaches are better controlled.   Neurology service will continue to follow up during her hospitalization. Please call for any further questions.

## 2015-03-08 NOTE — Progress Notes (Signed)
[facility]                                                                              Patient Demographics  Jill Ryan, is a 24 y.o. female, DOB - [birth year], VQQ:[id number]  Admit date - 03/07/2015   Admitting Physician Florencia Reasons, MD  Outpatient Primary MD for the patient is No primary care provider on file.  LOS - 1   Chief Complaint  Patient presents with  . vision changes        Brief HPI   Jill Ryan is a 24 y.o. female with no significant past medical history , except s/p cholecystectomy in 04/2014, presented to 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 She also reported some eye pain and some headache, no redness, no secretion, no fever, no weakness, no chest pain. Ed course: vital stable, MRI brain no acute findings. Basic labs showed hemoglobin of 16.4, otherwise unremarkable. Opthalmology was consulted by EDP recommended high dose steroids and neurology consult for possible optic neuritis, neurology recommended patient to be admitted to [facility]    Active Problems:   Optic neuritis, right: No prior similar deficits. No history or family history of MS - Per patient no significant improvement in her vision. - MRI of the pain and orbits showed right optic nerve edema and enhancement compatible with optic neuritis, no abnormal intracranial enhancement - ESR, CRP, TSH, B12 all within normal limits - Continue high-dose IV steroids, neurology and ophthalmology following  Smoking Patient counseled on nicotine cessation, continue nicotine patch  Code Status: Full code  Family Communication: Discussed in detail with the patient, all imaging results, lab results explained to the patient    Disposition Plan:   Time Spent in minutes  25 minutes  Procedures  MRI brain, orbits, MRA  Consults    Neurology Ophthalmology  DVT Prophylaxis  Lovenox   Medications  Scheduled Meds: . enoxaparin (LOVENOX) injection  40 mg Subcutaneous Q24H  . methylPREDNISolone (SOLU-MEDROL) injection  250 mg Intravenous 4 times per day  . nicotine  21 mg Transdermal Daily  . zolpidem  5 mg Oral QHS   Continuous Infusions:  PRN Meds:.oxyCODONE-acetaminophen, simethicone   Antibiotics   Anti-infectives    None        Subjective:   Jill Ryan was seen and examined today.  States no difference in the right eye vision, no improvement. Patient denies dizziness, chest pain, shortness of breath, abdominal pain, N/V/D/C, new weakness, numbess, tingling. No acute events overnight.    Objective:   Blood pressure 119/72, pulse 76, temperature 98.6 F (37 C), temperature source Oral, resp. rate 18, height _0  (1.549 m), weight 87.635 kg (193 lb 3.2 oz), SpO2 95 %.  Wt Readings from Last 3 Encounters:  03/07/15 87.635 kg (193 lb 3.2 oz)    No intake or output data in the 24 hours ending 03/08/15 1302  Exam  General: Alert and oriented x 3, NAD  HEENT:  visual  acuity restricted right eye, PERLA  Neck: Supple, no JVD, no masses  CVS: S1 S2 auscultated, no rubs, murmurs or gallops. Regular rate and rhythm.  Respiratory: Clear to auscultation bilaterally, no wheezing, rales or rhonchi  Abdomen: Soft, nontender, nondistended, + bowel sounds  Ext: no cyanosis clubbing or edema  Neuro: AAOx3, Cr N's II- XII. Strength 5/5 upper and lower extremities bilaterally  Skin: No rashes  Psych: Normal affect and demeanor, alert and oriented x3    Data Review   Micro Results No results found for this or any previous visit (from the past 240 hour(s)).  Radiology Reports Mr Virgel Paling Wo Contrast  03/07/2015  CLINICAL DATA:  Acute RIGHT eye visual loss. This began 5 days ago. Headache behind RIGHT eye. EXAM: MRI HEAD WITHOUT CONTRAST MRA HEAD WITHOUT CONTRAST TECHNIQUE: Multiplanar,  multiecho pulse sequences of the brain and surrounding structures were obtained without intravenous contrast. Angiographic images of the head were obtained using MRA technique without contrast. COMPARISON:  None. FINDINGS: MRI HEAD FINDINGS No evidence for acute infarction, hemorrhage, mass lesion, hydrocephalus, or extra-axial fluid. There is no atrophy or white matter disease. Pineal and cerebellar tonsils unremarkable. No upper cervical lesions. Normal pituitary with no evidence for chiasmatic compression or suprasellar mass. Flow voids are maintained throughout the carotid, basilar, and vertebral arteries. There are no areas of chronic hemorrhage. Visualized calvarium, skull base, and upper cervical osseous structures unremarkable. Scalp and extracranial soft tissues, sinuses, and mastoids show no acute process. Negative appearing orbits. Symmetric globes with no retrobulbar lesions observed. MRA HEAD FINDINGS The internal carotid arteries are widely patent. The basilar artery is widely patent with vertebrals codominant. No intracranial stenosis or aneurysm. Patency of the BILATERAL ophthalmic arteries is established on axial source images. IMPRESSION: Negative noncontrast MRI of the brain and MRA intracranial. Electronically Signed   By: Staci Righter M.D.   On: 03/07/2015 12:10   [name] Contrast  03/07/2015  CLINICAL DATA:  Acute RIGHT eye visual loss. This began 5 days ago. Headache behind RIGHT eye. EXAM: MRI HEAD WITHOUT CONTRAST MRA HEAD WITHOUT CONTRAST TECHNIQUE: Multiplanar, multiecho pulse sequences of the brain and surrounding structures were obtained without intravenous contrast. Angiographic images of the head were obtained using MRA technique without contrast. COMPARISON:  None. FINDINGS: MRI HEAD FINDINGS No evidence for acute infarction, hemorrhage, mass lesion, hydrocephalus, or extra-axial fluid. There is no atrophy or white matter disease. Pineal and cerebellar tonsils unremarkable. No  upper cervical lesions. Normal pituitary with no evidence for chiasmatic compression or suprasellar mass. Flow voids are maintained throughout the carotid, basilar, and vertebral arteries. There are no areas of chronic hemorrhage. Visualized calvarium, skull base, and upper cervical osseous structures unremarkable. Scalp and extracranial soft tissues, sinuses, and mastoids show no acute process. Negative appearing orbits. Symmetric globes with no retrobulbar lesions observed. MRA HEAD FINDINGS The internal carotid arteries are widely patent. The basilar artery is widely patent with vertebrals codominant. No intracranial stenosis or aneurysm. Patency of the BILATERAL ophthalmic arteries is established on axial source images. IMPRESSION: Negative noncontrast MRI of the brain and MRA intracranial. Electronically Signed   By: Staci Righter M.D.   On: 03/07/2015 12:10   Mr Jeri Cos Contrast  03/08/2015  CLINICAL DATA:  Progressive RIGHT eye vision loss for 5 days. EXAM: MRI HEAD WITH CONTRAST MR ORBITS WITHOUT AND WITH CONTRAST TECHNIQUE: Multiplanar, multiecho pulse sequences of the brain and surrounding structures were obtained with intravenous contrast. Multiplanar, multiecho pulse sequences of the  orbits and surrounding structures were obtained including fat saturation techniques, before and after intravenous contrast administration. CONTRAST:  31m MULTIHANCE GADOBENATE DIMEGLUMINE 529 MG/ML IV SOLN COMPARISON:  MRI of the head March 07, 2015 FINDINGS: MRI HEAD FINDINGS No abnormal intracranial enhancement. Ventricles and sulci remain normal for patient's age. No midline shift or mass effect. No enhancing mass lesions. No abnormal extra-axial fluid collections or suspicious extra-axial enhancement. MRI ORBITS FINDINGS Ocular globes intact, lenses are located. Move T2 bright signal within the RIGHT optic nerve, effacing the cerebral spinal fluid space of the optic nerve sleeves. Very faint associated RIGHT optic  nerve enhancement. Normal appearance optic chiasm and radiations. No orbital mass. Normal appearance of LEFT optic nerve and nerve sheath complex. Normal appearance of the extra-ocular muscles. Preservation orbital fat. No abnormal bone marrow signal. Minimal paranasal sinus mucosal thickening without air-fluid levels. IMPRESSION: RIGHT optic nerve edema and enhancement compatible with optic neuritis. No abnormal intracranial enhancement. Electronically Signed   By: CElon AlasM.D.   On: 03/08/2015 05:39   Mr ODarnelle CatalanWo/w Cm  03/08/2015  CLINICAL DATA:  Progressive RIGHT eye vision loss for 5 days. EXAM: MRI HEAD WITH CONTRAST MR ORBITS WITHOUT AND WITH CONTRAST TECHNIQUE: Multiplanar, multiecho pulse sequences of the brain and surrounding structures were obtained with intravenous contrast. Multiplanar, multiecho pulse sequences of the orbits and surrounding structures were obtained including fat saturation techniques, before and after intravenous contrast administration. CONTRAST:  133mMULTIHANCE GADOBENATE DIMEGLUMINE 529 MG/ML IV SOLN COMPARISON:  MRI of the head March 07, 2015 FINDINGS: MRI HEAD FINDINGS No abnormal intracranial enhancement. Ventricles and sulci remain normal for patient's age. No midline shift or mass effect. No enhancing mass lesions. No abnormal extra-axial fluid collections or suspicious extra-axial enhancement. MRI ORBITS FINDINGS Ocular globes intact, lenses are located. Move T2 bright signal within the RIGHT optic nerve, effacing the cerebral spinal fluid space of the optic nerve sleeves. Very faint associated RIGHT optic nerve enhancement. Normal appearance optic chiasm and radiations. No orbital mass. Normal appearance of LEFT optic nerve and nerve sheath complex. Normal appearance of the extra-ocular muscles. Preservation orbital fat. No abnormal bone marrow signal. Minimal paranasal sinus mucosal thickening without air-fluid levels. IMPRESSION: RIGHT optic nerve edema and  enhancement compatible with optic neuritis. No abnormal intracranial enhancement. Electronically Signed   By: CoElon Alas.D.   On: 03/08/2015 05:39    CBC  Recent Labs Lab 03/07/15 1108 03/07/15 2118 03/08/15 0540  WBC 9.3 6.8 6.8  HGB 16.4* 16.0* 16.5*  HCT 48.8* 48.9* 47.5*  PLT 249 179 165  MCV 94.0 94.6 92.6  MCH 31.6 30.9 32.2  MCHC 33.6 32.7 34.7  RDW 13.3 13.0 12.8  LYMPHSABS 1.2  --   --   MONOABS 0.6  --   --   EOSABS 0.2  --   --   BASOSABS 0.0  --   --     Chemistries   Recent Labs Lab 03/07/15 1108 03/07/15 2118 03/08/15 0540  NA 138  --  135  K 4.3  --  4.2  CL 107  --  104  CO2 22  --  21*  GLUCOSE 102*  --  155*  BUN 13  --  8  CREATININE 0.88 1.06* 0.71  CALCIUM 9.3  --  9.5  AST  --   --  17  ALT  --   --  19  ALKPHOS  --   --  90  BILITOT  --   --  1.0   ------------------------------------------------------------------------------------------------------------------ estimated creatinine clearance is 107.2 mL/min (by C-G formula based on Cr of 0.71). ------------------------------------------------------------------------------------------------------------------ No results for input(s): HGBA1C in the last 72 hours. ------------------------------------------------------------------------------------------------------------------ No results for input(s): CHOL, HDL, LDLCALC, TRIG, CHOLHDL, LDLDIRECT in the last 72 hours. ------------------------------------------------------------------------------------------------------------------  Recent Labs  03/08/15 0540  TSH 0.365   ------------------------------------------------------------------------------------------------------------------  Recent Labs  03/08/15 0540  VITAMINB12 316    Coagulation profile No results for input(s): INR, PROTIME in the last 168 hours.  No results for input(s): DDIMER in the last 72 hours.  Cardiac Enzymes No results for input(s): CKMB,  TROPONINI, MYOGLOBIN in the last 168 hours.  Invalid input(s): CK ------------------------------------------------------------------------------------------------------------------ Invalid input(s): POCBNP  No results for input(s): GLUCAP in the last 72 hours.   Rockland Kotarski M.D. [facility] 03/08/2015, 1:02 PM  Pager: [phone] Between 7am to 7pm - call Pager - [phone]  After 7pm go to [email or web address] - password [email or web address]  Call night coverage person covering after 7pm

## 2015-03-09 DIAGNOSIS — M6248 Contracture of muscle, other site: Secondary | ICD-10-CM

## 2015-03-09 DIAGNOSIS — H5461 Unqualified visual loss, right eye, normal vision left eye: Secondary | ICD-10-CM

## 2015-03-09 LAB — ANGIOTENSIN CONVERTING ENZYME: ANGIOTENSIN-CONVERTING ENZYME: 83 U/L — AB (ref 14–82)

## 2015-03-09 LAB — B. BURGDORFI ANTIBODIES: B burgdorferi Ab IgG+IgM: <0.91 {ISR} (ref 0.00–0.90)

## 2015-03-09 MED ORDER — OXYCODONE-ACETAMINOPHEN 5-325 MG PO TABS
1.0000 | ORAL_TABLET | ORAL | Status: DC | PRN
  Administered 2015-03-09 – 2015-03-12 (×14): 2 via ORAL
  Filled 2015-03-09 (×14): qty 2

## 2015-03-09 MED ORDER — INFLUENZA VAC SPLIT QUAD 0.5 ML IM SUSY
0.5000 mL | PREFILLED_SYRINGE | Freq: Once | INTRAMUSCULAR | Status: DC
  Filled 2015-03-09 (×2): qty 0.5

## 2015-03-09 NOTE — Progress Notes (Signed)
Subjective: Feels "maybe some improvement but still only able to see shadows". Able to count fingers but unable to see color with right eye.   Exam: Filed Vitals:   03/09/15 0224 03/09/15 0601  BP: 114/78 117/74  Pulse: 75 78  Temp: 98.2 F (36.8 C) 98.2 F (36.8 C)  Resp: 16 16    HEENT-  Normocephalic, no lesions, without obvious abnormality.  Normal external eye and conjunctiva.  Normal TM's bilaterally.  Normal auditory canals and external ears. Normal external nose, mucus membranes and septum.  Normal pharynx. Cardiovascular- S1, S2 normal, pulses palpable throughout   Lungs- chest clear, no wheezing, rales, normal symmetric air entry Abdomen- normal findings: bowel sounds normal Extremities- no edema Lymph-no adenopathy palpable Musculoskeletal-no joint tenderness, deformity or swelling Skin-warm and dry, no hyperpigmentation, vitiligo, or suspicious lesions    Gen: In bed, NAD MS: alert and oriented. Follows all commands. Speech clear CN: left pupil reactive round 2 mm, right pupil shows a APD reactive and 34m. Only able to see shadows but able to count fingers, unable to see color, right eye has full vision.  Face equal and symmetrical, TML, facial sensation intact. EOMI.  Motor: MAEW with 5/5 strength Sensory: intact throughout DTR: 2+ throughout  Pertinent Labs: ACE, ANCA pending ESR 3 CRP 0.6 HIV (-) B12 3NeolaPA-C [facility] [phone] Impression: 24YO with optic neuritis.  She has received 7/20 doses Solumedrol with only minimal improvement. Continues to have mild HA but improved with Depakote.    Recommendations: 1) Continue IV solumedrol for total 5 days.  2) will continue to follow.    MRoland Rack MD [facility] [phone] If 7pm- 7am, please page neurology on call as listed in [facility]

## 2015-03-09 NOTE — Progress Notes (Signed)
[facility]                                                                              Patient Demographics  Jill Ryan, is a 24 y.o. female, DOB - 05-27-1990, QBV:[id number]  Admit date - 03/07/2015   Admitting Physician Florencia Reasons, MD  Outpatient Primary MD for the patient is No primary care provider on file.  LOS - 2   Chief Complaint  Patient presents with  . vision changes        Brief HPI   Jill Ryan is a 24 y.o. female with no significant past medical history , except s/p cholecystectomy in 04/2014, presented to [facility] ED due to blindness right eye, she reported it started 5days prior to admission with loss of central vision, then symptom progressed to where she could only see shadows . She also reported some eye pain and some headache, no redness, no secretion, no fever, no weakness, no chest pain. Ed course: vital stable, MRI brain no acute findings. Basic labs showed hemoglobin of 16.4, otherwise unremarkable. Opthalmology was consulted by EDP recommended high dose steroids and neurology consult for possible optic neuritis, neurology recommended patient to be admitted to [place]    Active Problems:   Optic neuritis, right: No prior similar deficits. No history or family history of MS - Per patient no significant improvement in her vision. - MRI of the pain and orbits showed right optic nerve edema and enhancement compatible with optic neuritis, no abnormal intracranial enhancement - ESR, CRP, TSH, B12 all within normal limits - Continue high-dose IV steroids for 5 days total, neurology and ophthalmology following  Smoking Patient counseled on nicotine cessation, continue nicotine patch  Headache - Neurology started patient on IV Depacon and baclofen, patient reports Percocet helps  Code Status: Full code  Family Communication: Discussed in detail with the patient, all imaging results, lab results explained to the  patient    Disposition Plan:   Time Spent in minutes  25 minutes  Procedures  MRI brain, orbits, MRA  Consults   Neurology Ophthalmology  DVT Prophylaxis  Lovenox   Medications  Scheduled Meds: . baclofen  10 mg Oral Daily  . baclofen  5 mg Oral BID  . enoxaparin (LOVENOX) injection  40 mg Subcutaneous Q24H  . famotidine  20 mg Oral BID  . Influenza vac split quadrivalent PF  0.5 mL Intramuscular Once  . methylPREDNISolone (SOLU-MEDROL) injection  250 mg Intravenous 4 times per day  . nicotine  21 mg Transdermal Daily  . valproate sodium  125 mg Intravenous 4 times per day  . zolpidem  5 mg Oral QHS   Continuous Infusions:  PRN Meds:.oxyCODONE-acetaminophen, simethicone   Antibiotics   Anti-infectives    None        Subjective:   Jill Ryan was seen and examined today.  States minimal difference in the right eye vision, having headaches, no fevers or chills. Patient denies dizziness, chest pain, shortness of breath, abdominal pain, N/V/D/C, new weakness, numbess, tingling. No acute events overnight.    Objective:   Blood pressure 115/71, pulse  79, temperature 98.3 F (36.8 C), temperature source Oral, resp. rate 20, height '5\' 1"'  (1.549 m), weight 87.635 kg (193 lb 3.2 oz), SpO2 96 %.  Wt Readings from Last 3 Encounters:  03/07/15 87.635 kg (193 lb 3.2 oz)    No intake or output data in the 24 hours ending 03/09/15 1125  Exam  General: Alert and oriented x 3, NAD  HEENT:  visual acuity restricted right eye, PERLA  Neck: Supple, no JVD, no masses  CVS: S1 S2 auscultated, no rubs, murmurs or gallops. Regular rate and rhythm.  Respiratory: Clear to auscultation bilaterally, no wheezing, rales or rhonchi  Abdomen: Soft, nontender, nondistended, + bowel sounds  Ext: no cyanosis clubbing or edema  Neuro: AAOx3, Cr N's II- XII. Strength 5/5 upper and lower extremities bilaterally  Skin: No rashes  Psych: Normal affect and demeanor, alert and  oriented x3    Data Review   Micro Results No results found for this or any previous visit (from the past 240 hour(s)).  Radiology Reports Mr Virgel Paling Wo Contrast  03/07/2015  CLINICAL DATA:  Acute RIGHT eye visual loss. This began 5 days ago. Headache behind RIGHT eye. EXAM: MRI HEAD WITHOUT CONTRAST MRA HEAD WITHOUT CONTRAST TECHNIQUE: Multiplanar, multiecho pulse sequences of the brain and surrounding structures were obtained without intravenous contrast. Angiographic images of the head were obtained using MRA technique without contrast. COMPARISON:  None. FINDINGS: MRI HEAD FINDINGS No evidence for acute infarction, hemorrhage, mass lesion, hydrocephalus, or extra-axial fluid. There is no atrophy or white matter disease. Pineal and cerebellar tonsils unremarkable. No upper cervical lesions. Normal pituitary with no evidence for chiasmatic compression or suprasellar mass. Flow voids are maintained throughout the carotid, basilar, and vertebral arteries. There are no areas of chronic hemorrhage. Visualized calvarium, skull base, and upper cervical osseous structures unremarkable. Scalp and extracranial soft tissues, sinuses, and mastoids show no acute process. Negative appearing orbits. Symmetric globes with no retrobulbar lesions observed. MRA HEAD FINDINGS The internal carotid arteries are widely patent. The basilar artery is widely patent with vertebrals codominant. No intracranial stenosis or aneurysm. Patency of the BILATERAL ophthalmic arteries is established on axial source images. IMPRESSION: Negative noncontrast MRI of the brain and MRA intracranial. Electronically Signed   By: Staci Righter M.D.   On: 03/07/2015 12:10   [name] Contrast  03/07/2015  CLINICAL DATA:  Acute RIGHT eye visual loss. This began 5 days ago. Headache behind RIGHT eye. EXAM: MRI HEAD WITHOUT CONTRAST MRA HEAD WITHOUT CONTRAST TECHNIQUE: Multiplanar, multiecho pulse sequences of the brain and surrounding structures  were obtained without intravenous contrast. Angiographic images of the head were obtained using MRA technique without contrast. COMPARISON:  None. FINDINGS: MRI HEAD FINDINGS No evidence for acute infarction, hemorrhage, mass lesion, hydrocephalus, or extra-axial fluid. There is no atrophy or white matter disease. Pineal and cerebellar tonsils unremarkable. No upper cervical lesions. Normal pituitary with no evidence for chiasmatic compression or suprasellar mass. Flow voids are maintained throughout the carotid, basilar, and vertebral arteries. There are no areas of chronic hemorrhage. Visualized calvarium, skull base, and upper cervical osseous structures unremarkable. Scalp and extracranial soft tissues, sinuses, and mastoids show no acute process. Negative appearing orbits. Symmetric globes with no retrobulbar lesions observed. MRA HEAD FINDINGS The internal carotid arteries are widely patent. The basilar artery is widely patent with vertebrals codominant. No intracranial stenosis or aneurysm. Patency of the BILATERAL ophthalmic arteries is established on axial source images. IMPRESSION: Negative noncontrast MRI of the  brain and MRA intracranial. Electronically Signed   By: Staci Righter M.D.   On: 03/07/2015 12:10   Mr Jeri Cos Contrast  03/08/2015  CLINICAL DATA:  Progressive RIGHT eye vision loss for 5 days. EXAM: MRI HEAD WITH CONTRAST MR ORBITS WITHOUT AND WITH CONTRAST TECHNIQUE: Multiplanar, multiecho pulse sequences of the brain and surrounding structures were obtained with intravenous contrast. Multiplanar, multiecho pulse sequences of the orbits and surrounding structures were obtained including fat saturation techniques, before and after intravenous contrast administration. CONTRAST:  72m MULTIHANCE GADOBENATE DIMEGLUMINE 529 MG/ML IV SOLN COMPARISON:  MRI of the head March 07, 2015 FINDINGS: MRI HEAD FINDINGS No abnormal intracranial enhancement. Ventricles and sulci remain normal for patient's  age. No midline shift or mass effect. No enhancing mass lesions. No abnormal extra-axial fluid collections or suspicious extra-axial enhancement. MRI ORBITS FINDINGS Ocular globes intact, lenses are located. Move T2 bright signal within the RIGHT optic nerve, effacing the cerebral spinal fluid space of the optic nerve sleeves. Very faint associated RIGHT optic nerve enhancement. Normal appearance optic chiasm and radiations. No orbital mass. Normal appearance of LEFT optic nerve and nerve sheath complex. Normal appearance of the extra-ocular muscles. Preservation orbital fat. No abnormal bone marrow signal. Minimal paranasal sinus mucosal thickening without air-fluid levels. IMPRESSION: RIGHT optic nerve edema and enhancement compatible with optic neuritis. No abnormal intracranial enhancement. Electronically Signed   By: CElon AlasM.D.   On: 03/08/2015 05:39   Mr ODarnelle CatalanWo/w Cm  03/08/2015  CLINICAL DATA:  Progressive RIGHT eye vision loss for 5 days. EXAM: MRI HEAD WITH CONTRAST MR ORBITS WITHOUT AND WITH CONTRAST TECHNIQUE: Multiplanar, multiecho pulse sequences of the brain and surrounding structures were obtained with intravenous contrast. Multiplanar, multiecho pulse sequences of the orbits and surrounding structures were obtained including fat saturation techniques, before and after intravenous contrast administration. CONTRAST:  163mMULTIHANCE GADOBENATE DIMEGLUMINE 529 MG/ML IV SOLN COMPARISON:  MRI of the head March 07, 2015 FINDINGS: MRI HEAD FINDINGS No abnormal intracranial enhancement. Ventricles and sulci remain normal for patient's age. No midline shift or mass effect. No enhancing mass lesions. No abnormal extra-axial fluid collections or suspicious extra-axial enhancement. MRI ORBITS FINDINGS Ocular globes intact, lenses are located. Move T2 bright signal within the RIGHT optic nerve, effacing the cerebral spinal fluid space of the optic nerve sleeves. Very faint associated RIGHT  optic nerve enhancement. Normal appearance optic chiasm and radiations. No orbital mass. Normal appearance of LEFT optic nerve and nerve sheath complex. Normal appearance of the extra-ocular muscles. Preservation orbital fat. No abnormal bone marrow signal. Minimal paranasal sinus mucosal thickening without air-fluid levels. IMPRESSION: RIGHT optic nerve edema and enhancement compatible with optic neuritis. No abnormal intracranial enhancement. Electronically Signed   By: CoElon Alas.D.   On: 03/08/2015 05:39    CBC  Recent Labs Lab 03/07/15 1108 03/07/15 2118 03/08/15 0540  WBC 9.3 6.8 6.8  HGB 16.4* 16.0* 16.5*  HCT 48.8* 48.9* 47.5*  PLT 249 179 165  MCV 94.0 94.6 92.6  MCH 31.6 30.9 32.2  MCHC 33.6 32.7 34.7  RDW 13.3 13.0 12.8  LYMPHSABS 1.2  --   --   MONOABS 0.6  --   --   EOSABS 0.2  --   --   BASOSABS 0.0  --   --     Chemistries   Recent Labs Lab 03/07/15 1108 03/07/15 2118 03/08/15 0540  NA 138  --  135  K 4.3  --  4.2  CL 107  --  104  CO2 22  --  21*  GLUCOSE 102*  --  155*  BUN 13  --  8  CREATININE 0.88 1.06* 0.71  CALCIUM 9.3  --  9.5  AST  --   --  17  ALT  --   --  19  ALKPHOS  --   --  90  BILITOT  --   --  1.0   ------------------------------------------------------------------------------------------------------------------ estimated creatinine clearance is 107.2 mL/min (by C-G formula based on Cr of 0.71). ------------------------------------------------------------------------------------------------------------------ No results for input(s): HGBA1C in the last 72 hours. ------------------------------------------------------------------------------------------------------------------ No results for input(s): CHOL, HDL, LDLCALC, TRIG, CHOLHDL, LDLDIRECT in the last 72 hours. ------------------------------------------------------------------------------------------------------------------  Recent Labs  03/08/15 0540  TSH 0.365    ------------------------------------------------------------------------------------------------------------------  Recent Labs  03/08/15 0540  VITAMINB12 316    Coagulation profile No results for input(s): INR, PROTIME in the last 168 hours.  No results for input(s): DDIMER in the last 72 hours.  Cardiac Enzymes No results for input(s): CKMB, TROPONINI, MYOGLOBIN in the last 168 hours.  Invalid input(s): CK ------------------------------------------------------------------------------------------------------------------ Invalid input(s): POCBNP  No results for input(s): GLUCAP in the last 72 hours.   RAI,RIPUDEEP M.D. [facility] 03/09/2015, 11:25 AM  Pager: [phone] Between 7am to 7pm - call Pager - [phone]  After 7pm go to [email or web address] - password [email or web address]  Call night coverage person covering after 7pm

## 2015-03-10 ENCOUNTER — Inpatient Hospital Stay (HOSPITAL_COMMUNITY): Payer: Self-pay

## 2015-03-10 DIAGNOSIS — D869 Sarcoidosis, unspecified: Secondary | ICD-10-CM

## 2015-03-10 LAB — BASIC METABOLIC PANEL
Anion gap: 9 (ref 5–15)
BUN: 10 mg/dL (ref 6–20)
CALCIUM: 9 mg/dL (ref 8.9–10.3)
CO2: 26 mmol/L (ref 22–32)
CREATININE: 0.84 mg/dL (ref 0.44–1.00)
Chloride: 102 mmol/L (ref 101–111)
GFR calc Af Amer: >60 mL/min (ref >60)
GLUCOSE: 123 mg/dL — AB (ref 65–99)
Potassium: 4 mmol/L (ref 3.5–5.1)
Sodium: 137 mmol/L (ref 135–145)

## 2015-03-10 LAB — CBC
HEMATOCRIT: 45.5 % (ref 36.0–46.0)
Hemoglobin: 15 g/dL (ref 12.0–15.0)
MCH: 31.2 pg (ref 26.0–34.0)
MCHC: 33 g/dL (ref 30.0–36.0)
MCV: 94.6 fL (ref 78.0–100.0)
PLATELETS: 228 10*3/uL (ref 150–400)
RBC: 4.81 MIL/uL (ref 3.87–5.11)
RDW: 13 % (ref 11.5–15.5)
WBC: 14 10*3/uL — AB (ref 4.0–10.5)

## 2015-03-10 LAB — ANCA TITERS: P-ANCA: <1:20 {titer}

## 2015-03-10 MED ORDER — GABAPENTIN 600 MG PO TABS
300.0000 mg | ORAL_TABLET | Freq: Three times a day (TID) | ORAL | Status: DC
  Administered 2015-03-10 – 2015-03-12 (×7): 300 mg via ORAL
  Filled 2015-03-10 (×7): qty 1

## 2015-03-10 NOTE — Progress Notes (Signed)
[facility]                                                                              Patient Demographics  Jill Ryan, is a 24 y.o. female, DOB - 1990/05/15, WLS:[id number]  Admit date - 03/07/2015   Admitting Physician Florencia Reasons, MD  Outpatient Primary MD for the patient is No primary care provider on file.  LOS - 3   Chief Complaint  Patient presents with  . vision changes        Brief HPI   Jill Ryan is a 24 y.o. female with no significant past medical history , except s/p cholecystectomy in 04/2014, presented to [facility] ED due to blindness right eye, she reported it started 5days prior to admission with loss of central vision, then symptom progressed to where she could only see shadows . She also reported some eye pain and some headache, no redness, no secretion, no fever, no weakness, no chest pain. Ed course: vital stable, MRI brain no acute findings. Basic labs showed hemoglobin of 16.4, otherwise unremarkable. Opthalmology was consulted by EDP recommended high dose steroids and neurology consult for possible optic neuritis, neurology recommended patient to be admitted to [facility]    Active Problems:   Optic neuritis, right: No prior similar deficits. No history or family history of MS - Per patient, mild improvement in her vision. - MRI of the pain and orbits showed right optic nerve edema and enhancement compatible with optic neuritis, no abnormal intracranial enhancement - ESR, CRP, TSH, B12 all within normal limits - ACE level on the higher range, neurology ordered a CT chest to rule out sarcoid - Continue high-dose IV steroids for 5 days total, neurology following, patient was seen by ophthalmology on admission.   Smoking Patient counseled on nicotine cessation, continue nicotine patch  Headache - Continue baclofen, Depacon discontinued by neuro, continue Percocet as needed  Code Status: Full code  Family  Communication: Discussed in detail with the patient, all imaging results, lab results explained to the patient    Disposition Plan:   Time Spent in minutes  25 minutes  Procedures  MRI brain, orbits, MRA  Consults   Neurology Ophthalmology  DVT Prophylaxis  Lovenox   Medications  Scheduled Meds: . baclofen  10 mg Oral Daily  . baclofen  5 mg Oral BID  . enoxaparin (LOVENOX) injection  40 mg Subcutaneous Q24H  . famotidine  20 mg Oral BID  . gabapentin  300 mg Oral TID  . Influenza vac split quadrivalent PF  0.5 mL Intramuscular Once  . methylPREDNISolone (SOLU-MEDROL) injection  250 mg Intravenous 4 times per day  . nicotine  21 mg Transdermal Daily  . zolpidem  5 mg Oral QHS   Continuous Infusions:  PRN Meds:.oxyCODONE-acetaminophen, simethicone   Antibiotics   Anti-infectives    None        Subjective:   Jill Ryan was seen and examined today.  Feels some improvement in right eye vision today, was able to make out number of fingers on examination. Headaches improving, no fevers or chills. Patient denies dizziness, chest  pain, shortness of breath, abdominal pain, N/V/D/C, new weakness, numbess, tingling. No acute events overnight.    Objective:   Blood pressure 126/73, pulse 73, temperature 98.6 F (37 C), temperature source Oral, resp. rate 20, height $RemoveBe'5\' 1"'xzfLZcYgh$  (1.549 m), weight 87.635 kg (193 lb 3.2 oz), SpO2 99 %.  Wt Readings from Last 3 Encounters:  03/07/15 87.635 kg (193 lb 3.2 oz)     Intake/Output Summary (Last 24 hours) at 03/10/15 1103 Last data filed at 03/09/15 1310  Gross per 24 hour  Intake    240 ml  Output      0 ml  Net    240 ml    Exam  General: Alert and oriented x 3, NAD  HEENT:  visual acuity restricted right eye, PERLA  Neck: Supple, no JVD, no masses  CVS: S1 S2 clear, RRR  Respiratory: CTAB  Abdomen: Soft, nontender, nondistended, + bowel sounds  Ext: no cyanosis clubbing or edema  Neuro: no new  deficits  Skin: No rashes  Psych: Normal affect and demeanor, alert and oriented x3    Data Review   Micro Results No results found for this or any previous visit (from the past 240 hour(s)).  Radiology Reports Mr Virgel Paling Wo Contrast  03/07/2015  CLINICAL DATA:  Acute RIGHT eye visual loss. This began 5 days ago. Headache behind RIGHT eye. EXAM: MRI HEAD WITHOUT CONTRAST MRA HEAD WITHOUT CONTRAST TECHNIQUE: Multiplanar, multiecho pulse sequences of the brain and surrounding structures were obtained without intravenous contrast. Angiographic images of the head were obtained using MRA technique without contrast. COMPARISON:  None. FINDINGS: MRI HEAD FINDINGS No evidence for acute infarction, hemorrhage, mass lesion, hydrocephalus, or extra-axial fluid. There is no atrophy or white matter disease. Pineal and cerebellar tonsils unremarkable. No upper cervical lesions. Normal pituitary with no evidence for chiasmatic compression or suprasellar mass. Flow voids are maintained throughout the carotid, basilar, and vertebral arteries. There are no areas of chronic hemorrhage. Visualized calvarium, skull base, and upper cervical osseous structures unremarkable. Scalp and extracranial soft tissues, sinuses, and mastoids show no acute process. Negative appearing orbits. Symmetric globes with no retrobulbar lesions observed. MRA HEAD FINDINGS The internal carotid arteries are widely patent. The basilar artery is widely patent with vertebrals codominant. No intracranial stenosis or aneurysm. Patency of the BILATERAL ophthalmic arteries is established on axial source images. IMPRESSION: Negative noncontrast MRI of the brain and MRA intracranial. Electronically Signed   By: Staci Righter M.D.   On: 03/07/2015 12:10   [name] Contrast  03/07/2015  CLINICAL DATA:  Acute RIGHT eye visual loss. This began 5 days ago. Headache behind RIGHT eye. EXAM: MRI HEAD WITHOUT CONTRAST MRA HEAD WITHOUT CONTRAST TECHNIQUE:  Multiplanar, multiecho pulse sequences of the brain and surrounding structures were obtained without intravenous contrast. Angiographic images of the head were obtained using MRA technique without contrast. COMPARISON:  None. FINDINGS: MRI HEAD FINDINGS No evidence for acute infarction, hemorrhage, mass lesion, hydrocephalus, or extra-axial fluid. There is no atrophy or white matter disease. Pineal and cerebellar tonsils unremarkable. No upper cervical lesions. Normal pituitary with no evidence for chiasmatic compression or suprasellar mass. Flow voids are maintained throughout the carotid, basilar, and vertebral arteries. There are no areas of chronic hemorrhage. Visualized calvarium, skull base, and upper cervical osseous structures unremarkable. Scalp and extracranial soft tissues, sinuses, and mastoids show no acute process. Negative appearing orbits. Symmetric globes with no retrobulbar lesions observed. MRA HEAD FINDINGS The internal carotid arteries are widely  patent. The basilar artery is widely patent with vertebrals codominant. No intracranial stenosis or aneurysm. Patency of the BILATERAL ophthalmic arteries is established on axial source images. IMPRESSION: Negative noncontrast MRI of the brain and MRA intracranial. Electronically Signed   By: Staci Righter M.D.   On: 03/07/2015 12:10   Mr Jeri Cos Contrast  03/08/2015  CLINICAL DATA:  Progressive RIGHT eye vision loss for 5 days. EXAM: MRI HEAD WITH CONTRAST MR ORBITS WITHOUT AND WITH CONTRAST TECHNIQUE: Multiplanar, multiecho pulse sequences of the brain and surrounding structures were obtained with intravenous contrast. Multiplanar, multiecho pulse sequences of the orbits and surrounding structures were obtained including fat saturation techniques, before and after intravenous contrast administration. CONTRAST:  12m MULTIHANCE GADOBENATE DIMEGLUMINE 529 MG/ML IV SOLN COMPARISON:  MRI of the head March 07, 2015 FINDINGS: MRI HEAD FINDINGS No  abnormal intracranial enhancement. Ventricles and sulci remain normal for patient's age. No midline shift or mass effect. No enhancing mass lesions. No abnormal extra-axial fluid collections or suspicious extra-axial enhancement. MRI ORBITS FINDINGS Ocular globes intact, lenses are located. Move T2 bright signal within the RIGHT optic nerve, effacing the cerebral spinal fluid space of the optic nerve sleeves. Very faint associated RIGHT optic nerve enhancement. Normal appearance optic chiasm and radiations. No orbital mass. Normal appearance of LEFT optic nerve and nerve sheath complex. Normal appearance of the extra-ocular muscles. Preservation orbital fat. No abnormal bone marrow signal. Minimal paranasal sinus mucosal thickening without air-fluid levels. IMPRESSION: RIGHT optic nerve edema and enhancement compatible with optic neuritis. No abnormal intracranial enhancement. Electronically Signed   By: CElon AlasM.D.   On: 03/08/2015 05:39   Mr ODarnelle CatalanWo/w Cm  03/08/2015  CLINICAL DATA:  Progressive RIGHT eye vision loss for 5 days. EXAM: MRI HEAD WITH CONTRAST MR ORBITS WITHOUT AND WITH CONTRAST TECHNIQUE: Multiplanar, multiecho pulse sequences of the brain and surrounding structures were obtained with intravenous contrast. Multiplanar, multiecho pulse sequences of the orbits and surrounding structures were obtained including fat saturation techniques, before and after intravenous contrast administration. CONTRAST:  123mMULTIHANCE GADOBENATE DIMEGLUMINE 529 MG/ML IV SOLN COMPARISON:  MRI of the head March 07, 2015 FINDINGS: MRI HEAD FINDINGS No abnormal intracranial enhancement. Ventricles and sulci remain normal for patient's age. No midline shift or mass effect. No enhancing mass lesions. No abnormal extra-axial fluid collections or suspicious extra-axial enhancement. MRI ORBITS FINDINGS Ocular globes intact, lenses are located. Move T2 bright signal within the RIGHT optic nerve, effacing the  cerebral spinal fluid space of the optic nerve sleeves. Very faint associated RIGHT optic nerve enhancement. Normal appearance optic chiasm and radiations. No orbital mass. Normal appearance of LEFT optic nerve and nerve sheath complex. Normal appearance of the extra-ocular muscles. Preservation orbital fat. No abnormal bone marrow signal. Minimal paranasal sinus mucosal thickening without air-fluid levels. IMPRESSION: RIGHT optic nerve edema and enhancement compatible with optic neuritis. No abnormal intracranial enhancement. Electronically Signed   By: CoElon Alas.D.   On: 03/08/2015 05:39    CBC  Recent Labs Lab 03/07/15 1108 03/07/15 2118 03/08/15 0540 03/10/15 0815  WBC 9.3 6.8 6.8 14.0*  HGB 16.4* 16.0* 16.5* 15.0  HCT 48.8* 48.9* 47.5* 45.5  PLT 249 179 165 228  MCV 94.0 94.6 92.6 94.6  MCH 31.6 30.9 32.2 31.2  MCHC 33.6 32.7 34.7 33.0  RDW 13.3 13.0 12.8 13.0  LYMPHSABS 1.2  --   --   --   MONOABS 0.6  --   --   --  EOSABS 0.2  --   --   --   BASOSABS 0.0  --   --   --     Chemistries   Recent Labs Lab 03/07/15 1108 03/07/15 2118 03/08/15 0540 03/10/15 0815  NA 138  --  135 137  K 4.3  --  4.2 4.0  CL 107  --  104 102  CO2 22  --  21* 26  GLUCOSE 102*  --  155* 123*  Ryan 13  --  8 10  CREATININE 0.88 1.06* 0.71 0.84  CALCIUM 9.3  --  9.5 9.0  AST  --   --  17  --   ALT  --   --  19  --   ALKPHOS  --   --  90  --   BILITOT  --   --  1.0  --    ------------------------------------------------------------------------------------------------------------------ estimated creatinine clearance is 102.1 mL/min (by C-G formula based on Cr of 0.84). ------------------------------------------------------------------------------------------------------------------ No results for input(s): HGBA1C in the last 72 hours. ------------------------------------------------------------------------------------------------------------------ No results for input(s): CHOL,  HDL, LDLCALC, TRIG, CHOLHDL, LDLDIRECT in the last 72 hours. ------------------------------------------------------------------------------------------------------------------  Recent Labs  03/08/15 0540  TSH 0.365   ------------------------------------------------------------------------------------------------------------------  Recent Labs  03/08/15 0540  VITAMINB12 316    Coagulation profile No results for input(s): INR, PROTIME in the last 168 hours.  No results for input(s): DDIMER in the last 72 hours.  Cardiac Enzymes No results for input(s): CKMB, TROPONINI, MYOGLOBIN in the last 168 hours.  Invalid input(s): CK ------------------------------------------------------------------------------------------------------------------ Invalid input(s): POCBNP  No results for input(s): GLUCAP in the last 72 hours.   Shakisha Abend M.D. [facility] 03/10/2015, 11:03 AM  Pager: [phone] Between 7am to 7pm - call Pager - [phone]  After 7pm go to [email or web address] - password [email or web address]  Call night coverage person covering after 7pm

## 2015-03-10 NOTE — Progress Notes (Signed)
Patient's husband approached nurse and asked to speak with MD about taking patient to another hospital. Nurse asked him if anything was the matter and would like to talk to him in the room. While in patient's room, husband expressed frustration about patient not getting the right care as she is being stuck several times for blood draw and given Lovenox shots to her stomach and also that her pain medication was going to be discontinued. Husband was more annoyed about blood draws and pain medication. Nurse explained to him about the reasons for the blood draws and also that there were no orders to discontinue her pain meds but that Depakon was changed to Gabapentin. Nurse told patient that she has the right to refuse and if she chooses to go to another hospital, they will draw blood there also. Patient told nurse in the presence of her husband that she does not want to go and will stay for the remaining treatments. Nurse told patient and husband that they have the right to leave AMA but their insurance will not pay for this hospital stay. Patient again stated she wants to stay. Husband was upset at patient and told her that she was complaining when nurse was not present but now acting like its ok and left the room. MD notified of situation.

## 2015-03-10 NOTE — Progress Notes (Addendum)
Subjective: No significant improvement. Continues to have mild HA. Now wearing eye patch intermittently due to strain on the left eye.   Exam: Filed Vitals:   03/10/15 0128 03/10/15 0652  BP: 132/90 139/81  Pulse: 76 66  Temp: 98.4 F (36.9 C) 98.2 F (36.8 C)  Resp: 20 20    HEENT-  Normocephalic, no lesions, without obvious abnormality.  Normal external eye and conjunctiva.  Normal TM's bilaterally.  Normal auditory canals and external ears. Normal external nose, mucus membranes and septum.  Normal pharynx. Cardiovascular- S1, S2 normal, pulses palpable throughout   Lungs- chest clear, no wheezing, rales, normal symmetric air entry Abdomen- normal findings: bowel sounds normal Extremities- no edema Lymph-no adenopathy palpable Musculoskeletal-no joint tenderness, deformity or swelling Skin-warm and dry, no hyperpigmentation, vitiligo, or suspicious lesions    Gen: In bed, NAD MS: alert and oriented, speech clear and able to follow all commands.  CN: EOMI, TML, face symmetric, sensation intact. Pupils equal with right having APD and sluggishly reactive. Left pupil brisk and no APD Motor: MAEW Sensory: intact   Pertinent Labs: none  Jill MornDavid Smith PA-C [facility] [phone]  Patient complains of intermittent shooting pains in her shoudler as well, present for several weeks.   Impression: [age] YO with optic neuritis. She has received 11/20 doses Solumedrol. She has a mildly elevated ACE of unclear significance. I will check a chest CT to look for lymphadenopathy that could suggest sarcoid.   Recommendations: 1) finish 5 days Solumedrol 2) Chest CT 3) send NMO ab.  4) change from depakote to gabapentin.  Ritta SlotMcNeill Gala Padovano, MD [facility] [phone]  If 7pm- 7am, please page neurology on call as listed in AMION.   03/10/2015, 8:33 AM

## 2015-03-11 DIAGNOSIS — H469 Unspecified optic neuritis: Principal | ICD-10-CM

## 2015-03-11 LAB — NEUROMYELITIS OPTICA AUTOAB, IGG

## 2015-03-11 NOTE — Progress Notes (Signed)
[facility] PROGRESS NOTE    Progress Note   Jill Ryan MRN:[id number] DOB: 12/18/1990 DOA: 03/07/2015 PCP: No primary care provider on file.   Brief Narrative:   Jill Ryan is an 24 y.o. female with no past medical history except for a cholecystectomy in January 2006 they came into the ED with blindness from the right eye.  Assessment/Plan:   Optic neuritis, right: - MRI of the brain and orbit done that showed right optic edema, ESR TSH B-12 within normal limits. Neurology was consulted and recommended 5 days of IV Solu-Medrol. - Neurology was consulted which confirm optic neuritis. But on the change Neurontin to Depakote.  DVT Prophylaxis - Lovenox ordered.  Family Communication: mom Disposition Plan: Home when stable. Code Status:     Code Status Orders        Start     Ordered   03/07/15 2036  Full code   Continuous     03/07/15 2035        IV Access:    Peripheral IV   Procedures and diagnostic studies:   Ct Chest Wo Contrast  03/10/2015  CLINICAL DATA:  Sarcoid. Mid chest pain and shortness of breath yesterday. EXAM: CT CHEST WITHOUT CONTRAST TECHNIQUE: Multidetector CT imaging of the chest was performed following the standard protocol without IV contrast. COMPARISON:  None. FINDINGS: Linear areas of scarring in the lung bases. Lungs are otherwise clear. No pulmonary nodules. No pleural effusions. Heart is normal size. Aorta is normal caliber. No mediastinal, hilar, or axillary adenopathy. Chest wall soft tissues are unremarkable. Imaging into the upper abdomen shows no acute findings. IMPRESSION: No acute cardiopulmonary disease. Electronically Signed   By: Kevin  Dover M.D.   On: 03/10/2015 15:58     Medical Consultants:    None.  Anti-Infectives:   Anti-infectives    None      Subjective:    Jill Ryan relates patient continues to improve.  Objective:    Filed Vitals:   03/10/15 2208 03/11/15 0204 03/11/15 0600  03/11/15 1114  BP: 138/85 130/97 98/78 149/93  Pulse: 64 66 72 76  Temp: 98.3 F (36.8 C) 98.3 F (36.8 C) 98.1 F (36.7 C) 98.7 F (37.1 C)  TempSrc: Oral Oral Oral Oral  Resp: 20 20 20 20  Height:      Weight:      SpO2: 95% 93% 96% 98%   No intake or output data in the 24 hours ending 03/11/15 1222 Filed Weights   03/07/15 1809 03/07/15 2032  Weight: 88.451 kg (195 lb) 87.635 kg (193 lb 3.2 oz)    Exam: Gen:  NAD Cardiovascular:  RRR, No M/R/G Chest and lungs:   CTAB Abdomen:  Abdomen soft, NT/ND, + BS Extremities:  No C/E/C   Data Reviewed:    Labs: Basic Metabolic Panel:  Recent Labs Lab 03/07/15 1108 03/07/15 2118 03/08/15 0540 03/10/15 0815  NA 138  --  135 137  K 4.3  --  4.2 4.0  CL 107  --  104 102  CO2 22  --  21* 26  GLUCOSE 102*  --  155* 123*  BUN 13  --  8 10  CREATININE 0.88 1.06* 0.71 0.84  CALCIUM 9.3  --  9.5 9.0   GFR Estimated Creatinine Clearance: 102.1 mL/min (by C-G formula based on Cr of 0.84). Liver Function Tests:  Recent Labs Lab 03/08/15 0540  AST 17  ALT 19  ALKPHOS 90  BILITOT 1.0  PROT 6.9  ALBUMIN   3.8   No results for input(s): LIPASE, AMYLASE in the last 168 hours. No results for input(s): AMMONIA in the last 168 hours. Coagulation profile No results for input(s): INR, PROTIME in the last 168 hours.  CBC:  Recent Labs Lab 03/07/15 1108 03/07/15 2118 03/08/15 0540 03/10/15 0815  WBC 9.3 6.8 6.8 14.0*  NEUTROABS 7.3  --   --   --   HGB 16.4* 16.0* 16.5* 15.0  HCT 48.8* 48.9* 47.5* 45.5  MCV 94.0 94.6 92.6 94.6  PLT 249 179 165 228   Cardiac Enzymes: No results for input(s): CKTOTAL, CKMB, CKMBINDEX, TROPONINI in the last 168 hours. BNP (last 3 results) No results for input(s): PROBNP in the last 8760 hours. CBG: No results for input(s): GLUCAP in the last 168 hours. D-Dimer: No results for input(s): DDIMER in the last 72 hours. Hgb A1c: No results for input(s): HGBA1C in the last 72  hours. Lipid Profile: No results for input(s): CHOL, HDL, LDLCALC, TRIG, CHOLHDL, LDLDIRECT in the last 72 hours. Thyroid function studies: No results for input(s): TSH, T4TOTAL, T3FREE, THYROIDAB in the last 72 hours.  Invalid input(s): FREET3 Anemia work up: No results for input(s): VITAMINB12, FOLATE, FERRITIN, TIBC, IRON, RETICCTPCT in the last 72 hours. Sepsis Labs:  Recent Labs Lab 03/07/15 1108 03/07/15 2118 03/08/15 0540 03/10/15 0815  WBC 9.3 6.8 6.8 14.0*   Microbiology No results found for this or any previous visit (from the past 240 hour(s)).   Medications:   . baclofen  10 mg Oral Daily  . baclofen  5 mg Oral BID  . enoxaparin (LOVENOX) injection  40 mg Subcutaneous Q24H  . famotidine  20 mg Oral BID  . gabapentin  300 mg Oral TID  . Influenza vac split quadrivalent PF  0.5 mL Intramuscular Once  . methylPREDNISolone (SOLU-MEDROL) injection  250 mg Intravenous 4 times per day  . nicotine  21 mg Transdermal Daily  . zolpidem  5 mg Oral QHS   Continuous Infusions:   Time spent: 15 min   LOS: 4 days   Charlynne Cousins  [facility] Pager [phone]  *Please refer to [email or web address], password [email or web address] to get updated schedule on who will round on this patient, as hospitalists switch teams weekly. If 7PM-7AM, please contact night-coverage at [email or web address], password [email or web address] for any overnight needs.  03/11/2015, 12:22 PM

## 2015-03-11 NOTE — Progress Notes (Signed)
Subjective: No change over night still can only see shadows.   Exam: Filed Vitals:   03/11/15 0204 03/11/15 0600  BP: 130/97 98/78  Pulse: 66 72  Temp: 98.3 F (36.8 C) 98.1 F (36.7 C)  Resp: 20 20    HEENT- Normocephalic, no lesions, without obvious abnormality. Normal external eye and conjunctiva. Normal TM's bilaterally. Normal auditory canals and external ears. Normal external nose, mucus membranes and septum. Normal pharynx. Cardiovascular- S1, S2 normal, pulses palpable throughout  Lungs- chest clear, no wheezing, rales, normal symmetric air entry Abdomen- normal findings: bowel sounds normal Extremities- no edema Lymph-no adenopathy palpable Musculoskeletal-no joint tenderness, deformity or swelling Skin-warm and dry, no hyperpigmentation, vitiligo, or suspicious lesions    Gen: In bed, NAD MS: alert and oriented, speech clear and able to follow all commands.  CN: EOMI, TML, face symmetric, sensation intact. Pupils equal with right having APD and reactive. Left pupil brisk and no APD Motor: MAEW Sensory: intact  Pertinent Labs: Chest CT normal   Felicie MornDavid Smith PA-C [facility] [phone]  Impression: 24 YO with optic neuritis. She has received 15/20 doses Solumedrol. She has a mildly elevated ACE of unclear significance.  chest CT showed no lymphadenopathy so I do not think sarcoid is likely. NMO ab negative   Recommendations: 1) finish 5 days Solumedrol 2) continue gabapentin  Ritta SlotMcNeill Derelle Cockrell, MD [facility] [phone]  If 7pm- 7am, please page neurology on call as listed in AMION. 03/11/2015, 8:46 AM

## 2015-03-11 NOTE — Progress Notes (Signed)
Pt educated on safety concerns of leaving off unit. Pt is independent. Gait steady but her mother took her off unit in a wheel chair.

## 2015-03-11 NOTE — Progress Notes (Signed)
Pt requested to leave off unit to go to the cafeteria with her mom. Spoke with Md received verbal order that pt may go off unit with family. Pt notified. No noted distress. Will continue to monitor.

## 2015-03-12 DIAGNOSIS — G43719 Chronic migraine without aura, intractable, without status migrainosus: Secondary | ICD-10-CM

## 2015-03-12 LAB — METHYLMALONIC ACID, SERUM: Methylmalonic Acid, Quantitative: 372 nmol/L (ref 0–378)

## 2015-03-12 MED ORDER — OXYCODONE-ACETAMINOPHEN 5-325 MG PO TABS: 1.0000 | ORAL_TABLET | ORAL | Status: DC | PRN

## 2015-03-12 MED ORDER — FAMOTIDINE 20 MG PO TABS: 20.0000 mg | ORAL_TABLET | Freq: Two times a day (BID) | ORAL | Status: DC

## 2015-03-12 NOTE — Care Management Note (Signed)
Case Management Note  Patient Details  Name: Renford DillsDeborah Pickup MRN: [id number] Date of Birth: 12/23/1990  Subjective/Objective:                    Action/Plan: Patient discharging home today with self care. No PCP per patient. CM called and set [facility] with [facility] (who [facility] Patient also instructed on the use of their pharmacy for discounted medications. Patient without insurance and is discharging on 2 medications: percocet which CM is not able to assist with and [facility]. Bedside RN updated.   Expected Discharge Date:  03/10/15               Expected Discharge Plan:  Home/Self Care  In-House Referral:     Discharge planning Services  CM Consult  Post Acute Care Choice:    Choice offered to:     DME Arranged:    DME Agency:     HH Arranged:    HH Agency:     Status of Service:  Completed, signed off  Medicare Important Message Given:    Date Medicare IM Given:    Medicare IM give by:    Date Additional Medicare IM Given:    Additional Medicare Important Message give by:     If discussed at Long Length of Stay Meetings, dates discussed:    Additional Comments:  Kermit BaloKelli F Janiya Millirons, RN 03/12/2015, 2:02 PM

## 2015-03-12 NOTE — Progress Notes (Signed)
Pt discharging at this time with family taking all personal belongings. Discharge instructions and prescriptions provided with verbal understanding. Pt made aware of follow up appt. No noted distress.

## 2015-03-12 NOTE — Discharge Summary (Signed)
Physician Discharge Summary  Jill Ryan JQG:[id number] DOB: 1990-09-05 DOA: 03/07/2015  PCP: No primary care provider on file.  Admit date: 03/07/2015 Discharge date: 03/12/2015  Time spent: 35 minutes  Recommendations for Outpatient Follow-up:  1. Follow up with neurologist as an outpatient in 4 week 2. PCP in 2-4 weeks.   Discharge Diagnoses:  Active Problems:   Optic neuritis, right   Vision loss of right eye   Intractable chronic migraine without aura and without status migrainosus   Muscle spasms of neck   Sarcoid ([place])   Discharge Condition: stable  Diet recommendation: regular  Filed Weights   03/07/15 1809 03/07/15 2032  Weight: 88.451 kg (195 lb) 87.635 kg (193 lb 3.2 oz)    History of present illness:  24 y.o. female   With no significant past medical history , except s/p cholecystectomy in 04/2014, presented to [facility] ED due to blindness right eye, she reported it started 5days ago with loss of central vision, then symptom progressed to where she could only see shadows . She also reported some eye pain and some headache, no redness, no secretion, no fever, no weakness, no chest pain.   Hospital Course:  Optic neuritis, right: - MRI of the brain and orbit done that showed right optic edema, ESR TSH B-12 within normal limits.  - Neurology was consulted and recommended 5 days of IV Solu-Medrol. -  Ophthalmogy was consulted which confirm optic neuritis. But on the change Neurontin to Depakote. - will need to follow up with opthalmology and neurology as an outpatient. - etiology remain es unknown.  Procedures:  MRI brian  Consultations:  Neuro  ophthalmology    Discharge Exam: Filed Vitals:   03/12/15 0605 03/12/15 1034  BP: 145/92 132/82  Pulse: 54 54  Temp: 97.5 F (36.4 C) 98.5 F (36.9 C)  Resp: 16 18    General: A&O x3 Cardiovascular: RRR Respiratory: good air movement  Discharge Instructions   Discharge Instructions    Ambulatory  referral to Neurology    Complete by:  As directed   An appointment is requested in approximately: 2 weeks     Diet - low sodium heart healthy    Complete by:  As directed      Increase activity slowly    Complete by:  As directed           Current Discharge Medication List    START taking these medications   Details  famotidine (PEPCID) 20 MG tablet Take 1 tablet (20 mg total) by mouth 2 (two) times daily. Qty: 30 tablet, Refills: 0    oxyCODONE-acetaminophen (PERCOCET/ROXICET) 5-325 MG tablet Take 1-2 tablets by mouth every 4 (four) hours as needed for moderate pain. Qty: 30 tablet, Refills: 0      CONTINUE these medications which have NOT CHANGED   Details  Acetaminophen (TYLENOL PO) Take 1 tablet by mouth once. Tylenol tension       No Known Allergies Follow-up Information    Follow up with [facility].   Why:  [phone]   Appointment is January 9th at 8:45 am. Please bring picture ID and medications to the appointment.    Contact information:   [place] [phone]        The results of significant diagnostics from this hospitalization (including imaging, microbiology, ancillary and laboratory) are listed below for reference.    Significant Diagnostic Studies: Ct Chest Wo Contrast  03/10/2015  CLINICAL DATA:  Sarcoid. Mid chest pain and shortness of breath yesterday. EXAM: CT CHEST WITHOUT CONTRAST TECHNIQUE: Multidetector CT imaging of the chest was performed following the standard protocol without IV contrast. COMPARISON:  None. FINDINGS: Linear areas of scarring in the lung bases. Lungs are otherwise clear. No pulmonary nodules. No pleural effusions. Heart is normal size. Aorta is normal caliber. No mediastinal, hilar, or axillary adenopathy. Chest wall soft tissues are unremarkable. Imaging into the upper abdomen shows no acute findings. IMPRESSION: No acute cardiopulmonary disease. Electronically Signed   By:  Rolm Baptise M.D.   On: 03/10/2015 15:58   Mr Jodene Nam Head Wo Contrast  03/07/2015  CLINICAL DATA:  Acute RIGHT eye visual loss. This began 5 days ago. Headache behind RIGHT eye. EXAM: MRI HEAD WITHOUT CONTRAST MRA HEAD WITHOUT CONTRAST TECHNIQUE: Multiplanar, multiecho pulse sequences of the brain and surrounding structures were obtained without intravenous contrast. Angiographic images of the head were obtained using MRA technique without contrast. COMPARISON:  None. FINDINGS: MRI HEAD FINDINGS No evidence for acute infarction, hemorrhage, mass lesion, hydrocephalus, or extra-axial fluid. There is no atrophy or white matter disease. Pineal and cerebellar tonsils unremarkable. No upper cervical lesions. Normal pituitary with no evidence for chiasmatic compression or suprasellar mass. Flow voids are maintained throughout the carotid, basilar, and vertebral arteries. There are no areas of chronic hemorrhage. Visualized calvarium, skull base, and upper cervical osseous structures unremarkable. Scalp and extracranial soft tissues, sinuses, and mastoids show no acute process. Negative appearing orbits. Symmetric globes with no retrobulbar lesions observed. MRA HEAD FINDINGS The internal carotid arteries are widely patent. The basilar artery is widely patent with vertebrals codominant. No intracranial stenosis or aneurysm. Patency of the BILATERAL ophthalmic arteries is established on axial source images. IMPRESSION: Negative noncontrast MRI of the brain and MRA intracranial. Electronically Signed   By: Staci Righter M.D.   On: 03/07/2015 12:10   [name] Contrast  03/07/2015  CLINICAL DATA:  Acute RIGHT eye visual loss. This began 5 days ago. Headache behind RIGHT eye. EXAM: MRI HEAD WITHOUT CONTRAST MRA HEAD WITHOUT CONTRAST TECHNIQUE: Multiplanar, multiecho pulse sequences of the brain and surrounding structures were obtained without intravenous contrast. Angiographic images of the head were obtained using MRA  technique without contrast. COMPARISON:  None. FINDINGS: MRI HEAD FINDINGS No evidence for acute infarction, hemorrhage, mass lesion, hydrocephalus, or extra-axial fluid. There is no atrophy or white matter disease. Pineal and cerebellar tonsils unremarkable. No upper cervical lesions. Normal pituitary with no evidence for chiasmatic compression or suprasellar mass. Flow voids are maintained throughout the carotid, basilar, and vertebral arteries. There are no areas of chronic hemorrhage. Visualized calvarium, skull base, and upper cervical osseous structures unremarkable. Scalp and extracranial soft tissues, sinuses, and mastoids show no acute process. Negative appearing orbits. Symmetric globes with no retrobulbar lesions observed. MRA HEAD FINDINGS The internal carotid arteries are widely patent. The basilar artery is widely patent with vertebrals codominant. No intracranial stenosis or aneurysm. Patency of the BILATERAL ophthalmic arteries is established on axial source images. IMPRESSION: Negative noncontrast MRI of the brain and MRA intracranial. Electronically Signed   By: Staci Righter M.D.   On: 03/07/2015 12:10   Mr Jeri Cos Contrast  03/08/2015  CLINICAL DATA:  Progressive RIGHT eye vision loss for 5 days. EXAM: MRI HEAD WITH CONTRAST MR ORBITS WITHOUT AND WITH CONTRAST TECHNIQUE: Multiplanar, multiecho pulse sequences of the brain and surrounding structures were obtained with intravenous contrast. Multiplanar, multiecho pulse sequences of the orbits and surrounding structures  were obtained including fat saturation techniques, before and after intravenous contrast administration. CONTRAST:  66m MULTIHANCE GADOBENATE DIMEGLUMINE 529 MG/ML IV SOLN COMPARISON:  MRI of the head March 07, 2015 FINDINGS: MRI HEAD FINDINGS No abnormal intracranial enhancement. Ventricles and sulci remain normal for patient's age. No midline shift or mass effect. No enhancing mass lesions. No abnormal extra-axial fluid  collections or suspicious extra-axial enhancement. MRI ORBITS FINDINGS Ocular globes intact, lenses are located. Move T2 bright signal within the RIGHT optic nerve, effacing the cerebral spinal fluid space of the optic nerve sleeves. Very faint associated RIGHT optic nerve enhancement. Normal appearance optic chiasm and radiations. No orbital mass. Normal appearance of LEFT optic nerve and nerve sheath complex. Normal appearance of the extra-ocular muscles. Preservation orbital fat. No abnormal bone marrow signal. Minimal paranasal sinus mucosal thickening without air-fluid levels. IMPRESSION: RIGHT optic nerve edema and enhancement compatible with optic neuritis. No abnormal intracranial enhancement. Electronically Signed   By: CElon AlasM.D.   On: 03/08/2015 05:39   Mr ODarnelle CatalanWo/w Cm  03/08/2015  CLINICAL DATA:  Progressive RIGHT eye vision loss for 5 days. EXAM: MRI HEAD WITH CONTRAST MR ORBITS WITHOUT AND WITH CONTRAST TECHNIQUE: Multiplanar, multiecho pulse sequences of the brain and surrounding structures were obtained with intravenous contrast. Multiplanar, multiecho pulse sequences of the orbits and surrounding structures were obtained including fat saturation techniques, before and after intravenous contrast administration. CONTRAST:  138mMULTIHANCE GADOBENATE DIMEGLUMINE 529 MG/ML IV SOLN COMPARISON:  MRI of the head March 07, 2015 FINDINGS: MRI HEAD FINDINGS No abnormal intracranial enhancement. Ventricles and sulci remain normal for patient's age. No midline shift or mass effect. No enhancing mass lesions. No abnormal extra-axial fluid collections or suspicious extra-axial enhancement. MRI ORBITS FINDINGS Ocular globes intact, lenses are located. Move T2 bright signal within the RIGHT optic nerve, effacing the cerebral spinal fluid space of the optic nerve sleeves. Very faint associated RIGHT optic nerve enhancement. Normal appearance optic chiasm and radiations. No orbital mass. Normal  appearance of LEFT optic nerve and nerve sheath complex. Normal appearance of the extra-ocular muscles. Preservation orbital fat. No abnormal bone marrow signal. Minimal paranasal sinus mucosal thickening without air-fluid levels. IMPRESSION: RIGHT optic nerve edema and enhancement compatible with optic neuritis. No abnormal intracranial enhancement. Electronically Signed   By: CoElon Alas.D.   On: 03/08/2015 05:39    Microbiology: No results found for this or any previous visit (from the past 240 hour(s)).   Labs: Basic Metabolic Panel:  Recent Labs Lab 03/07/15 1108 03/07/15 2118 03/08/15 0540 03/10/15 0815  NA 138  --  135 137  K 4.3  --  4.2 4.0  CL 107  --  104 102  CO2 22  --  21* 26  GLUCOSE 102*  --  155* 123*  BUN 13  --  8 10  CREATININE 0.88 1.06* 0.71 0.84  CALCIUM 9.3  --  9.5 9.0   Liver Function Tests:  Recent Labs Lab 03/08/15 0540  AST 17  ALT 19  ALKPHOS 90  BILITOT 1.0  PROT 6.9  ALBUMIN 3.8   No results for input(s): LIPASE, AMYLASE in the last 168 hours. No results for input(s): AMMONIA in the last 168 hours. CBC:  Recent Labs Lab 03/07/15 1108 03/07/15 2118 03/08/15 0540 03/10/15 0815  WBC 9.3 6.8 6.8 14.0*  NEUTROABS 7.3  --   --   --   HGB 16.4* 16.0* 16.5* 15.0  HCT 48.8* 48.9* 47.5* 45.5  MCV 94.0 94.6  92.6 94.6  PLT 249 179 165 228   Cardiac Enzymes: No results for input(s): CKTOTAL, CKMB, CKMBINDEX, TROPONINI in the last 168 hours. BNP: BNP (last 3 results) No results for input(s): BNP in the last 8760 hours.  ProBNP (last 3 results) No results for input(s): PROBNP in the last 8760 hours.  CBG: No results for input(s): GLUCAP in the last 168 hours.     Signed:  Charlynne Cousins  [facility] 03/12/2015, 12:34 PM

## 2015-03-12 NOTE — Progress Notes (Signed)
Subjective: patient feels her vision is slightly improved.   Exam: Filed Vitals:   03/12/15 0235 03/12/15 0605  BP: 153/98 145/92  Pulse: 51 54  Temp: 97.9 F (36.6 C) 97.5 F (36.4 C)  Resp: 16 16    HEENT-  Normocephalic, no lesions, without obvious abnormality.  Normal external eye and conjunctiva.  Normal TM's bilaterally.  Normal auditory canals and external ears. Normal external nose, mucus membranes and septum.  Normal pharynx. Cardiovascular- S1, S2 normal, pulses palpable throughout   Lungs- chest clear, no wheezing, rales, normal symmetric air entry Abdomen- normal findings: bowel sounds normal Extremities- no edema    Gen: In bed, NAD MS: alert and oriented, speech clear and able to follow all commands.  CN: EOMI, TML, face symmetric, sensation intact. Pupils equal with right having APD and reactive. Left pupil brisk and no APD Motor: MAEW Sensory: intact    Pertinent Labs: none  Felicie MornDavid Jamoni Hewes PA-C [facility] [phone]  Impression: 24 YO with optic neuritis. She has received 19/20 doses Solumedrol. She has a mildly elevated ACE of unclear significance. chest CT showed no lymphadenopathy so I do not think sarcoid is likely. NMO ab negative   Recommendations: 1) Finish last dose of solumedrol. No taper needed.  2) Follow up in 2-3 weeks with out patient neurology.  Prefer Dr. Epimenio FootSater of [facility].     03/12/2015, 9:11 AM

## 2015-03-13 LAB — ANTIEXTRACTABLE NUCLEAR AG

## 2015-04-13 ENCOUNTER — Ambulatory Visit: Payer: Self-pay | Admitting: Family Medicine

## 2015-04-20 ENCOUNTER — Ambulatory Visit (INDEPENDENT_AMBULATORY_CARE_PROVIDER_SITE_OTHER): Payer: Self-pay | Admitting: Neurology

## 2015-04-20 ENCOUNTER — Encounter: Payer: Self-pay | Admitting: Neurology

## 2015-04-20 DIAGNOSIS — H469 Unspecified optic neuritis: Secondary | ICD-10-CM

## 2015-04-20 NOTE — Progress Notes (Signed)
ERROR

## 2015-04-21 ENCOUNTER — Encounter: Payer: Self-pay | Admitting: Neurology

## 2016-08-31 IMAGING — CT CT CHEST W/O CM
2 of 4 series · 15 of 36 positions shown, 18 images · non-contrast
Comparison: None.

CLINICAL DATA: Sarcoid. Mid chest pain and shortness of breath
yesterday.

EXAM:
CT CHEST WITHOUT CONTRAST
TECHNIQUE: Multidetector CT imaging of the chest was performed following the
standard protocol without IV contrast.

[Series 4: chest w/o 3mm st cor · coronal · non-contrast · 0.62mm/px · 3 of 82 slices shown]
[im 17/82  lung]
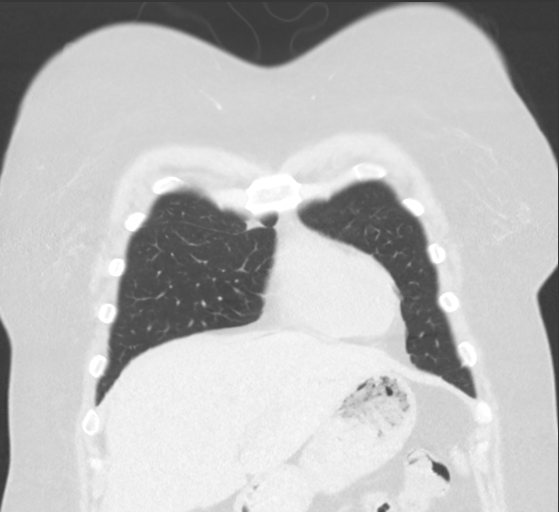
[im 33/82  lung]
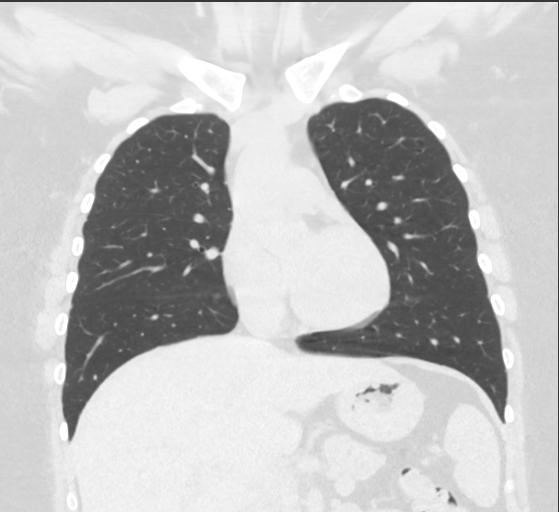
[im 49/82  lung]
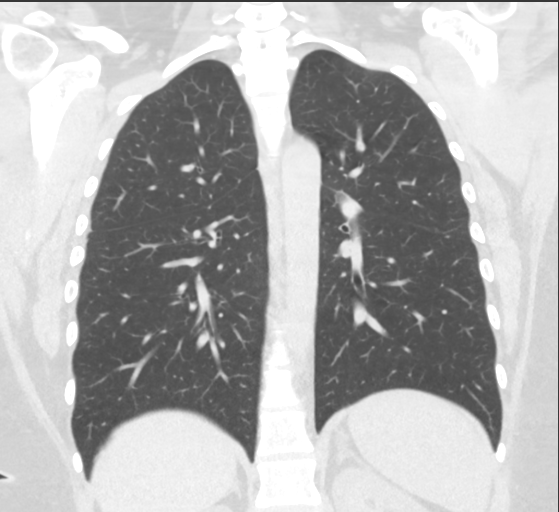

[Series 6: chest w/o 1mm st · axial · non-contrast · 0.73mm/px · z∈[-200,+89]mm · 12 of 397 slices shown, 15 images]
[im 18/397  mediastinal]
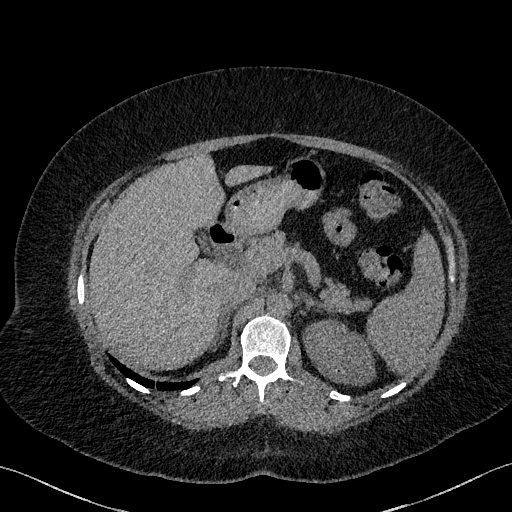
[im 18/397  lung]
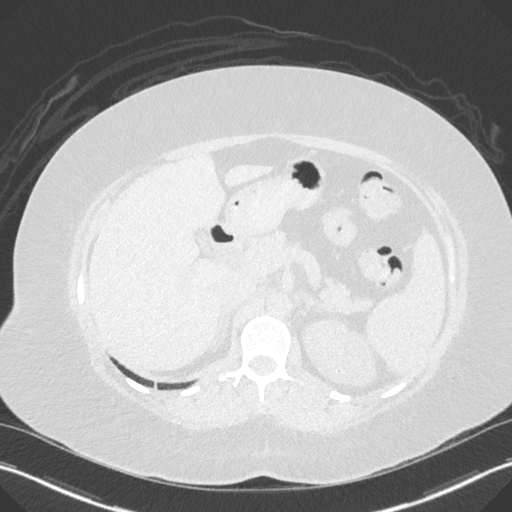
[im 52/397  lung]
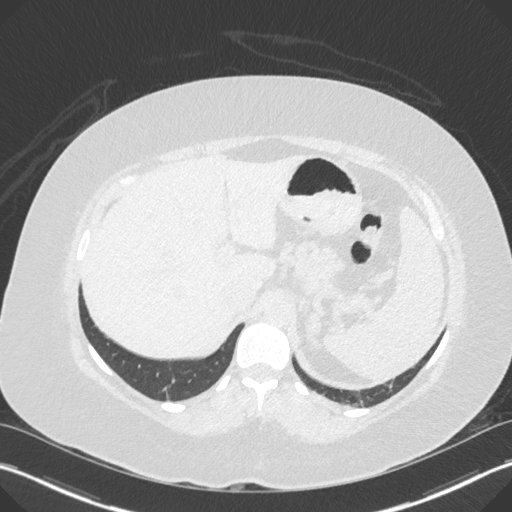
[im 87/397  lung]
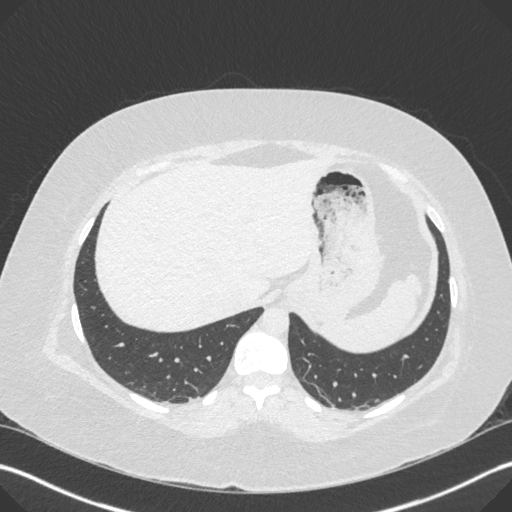
[im 121/397  lung]
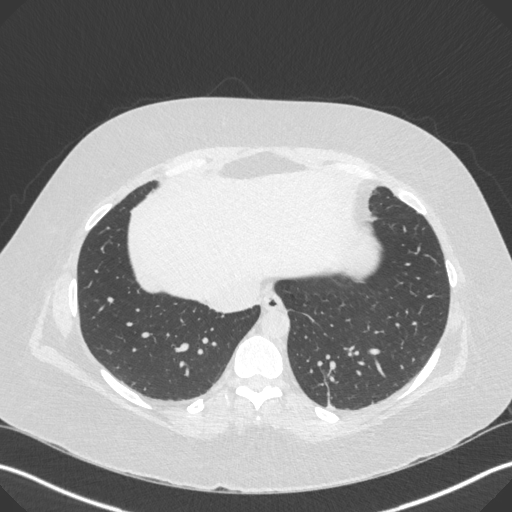
[im 155/397  mediastinal]
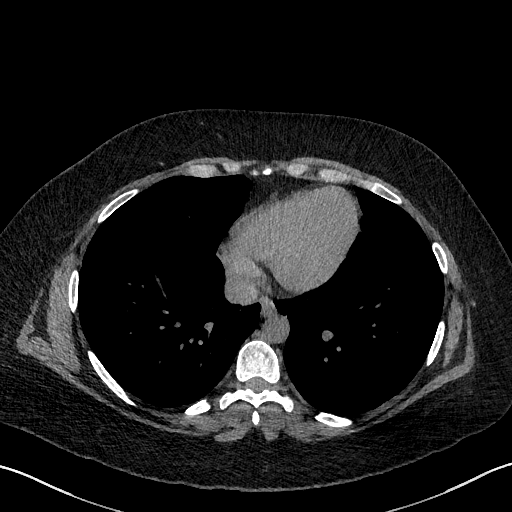
[im 155/397  lung]
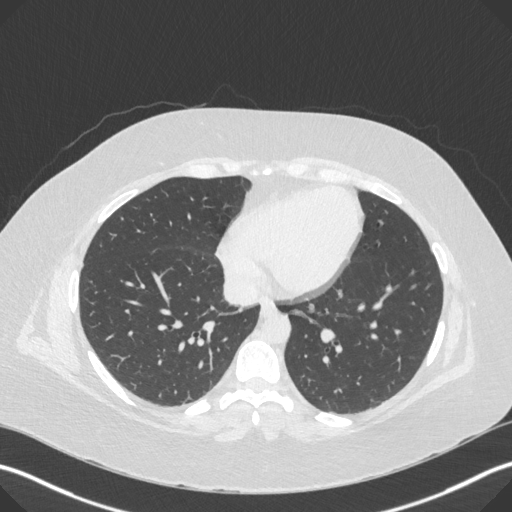
[im 190/397  lung]
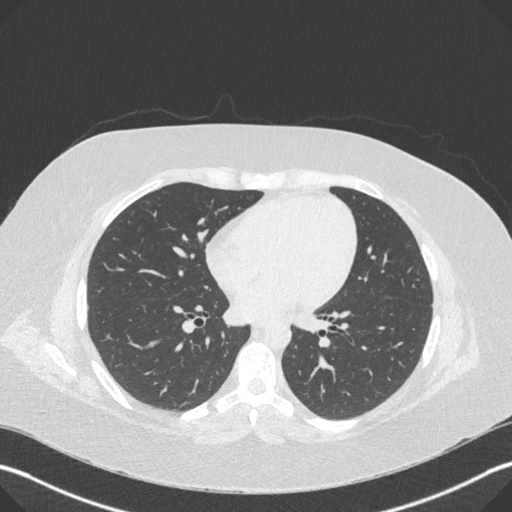
[im 207/397  lung]
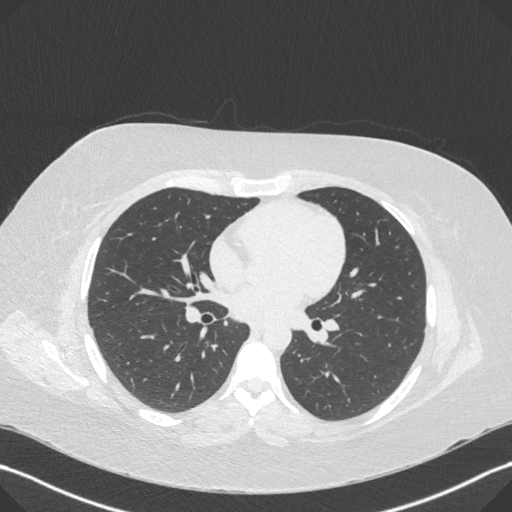
[im 242/397  lung]
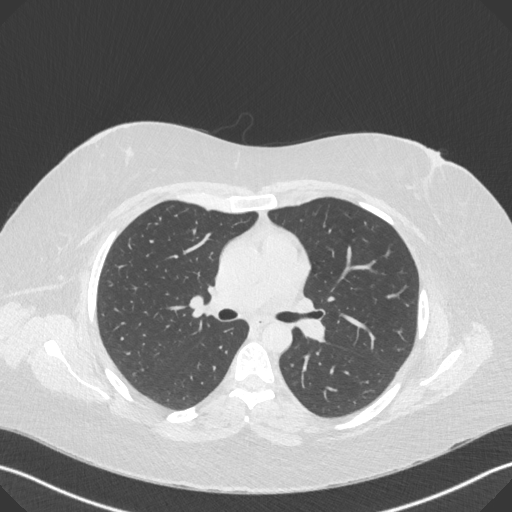
[im 276/397  mediastinal]
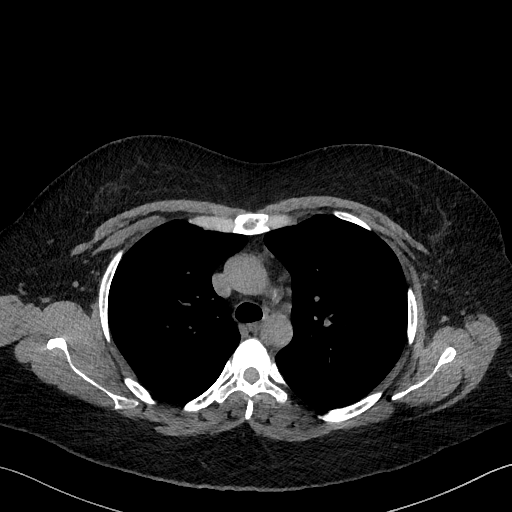
[im 276/397  lung]
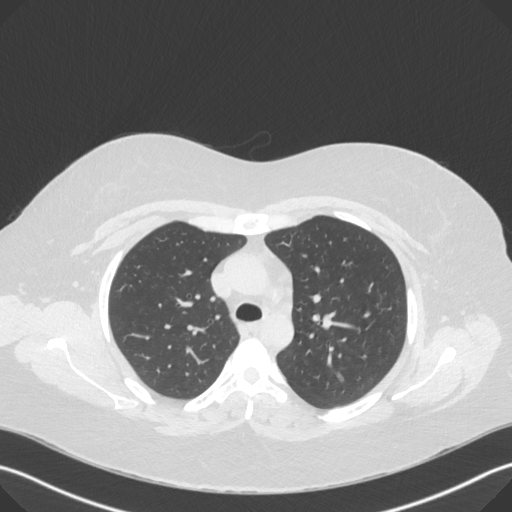
[im 310/397  lung]
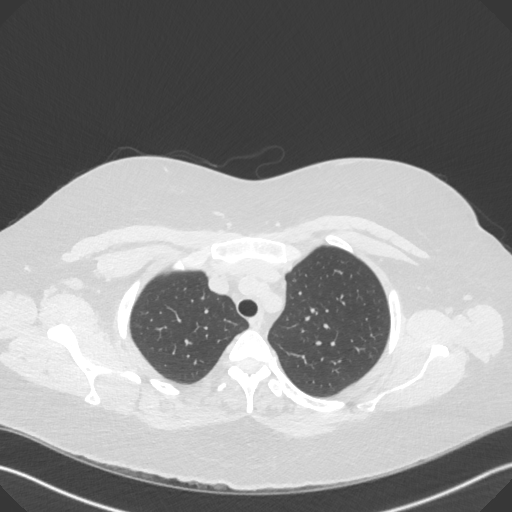
[im 345/397  lung]
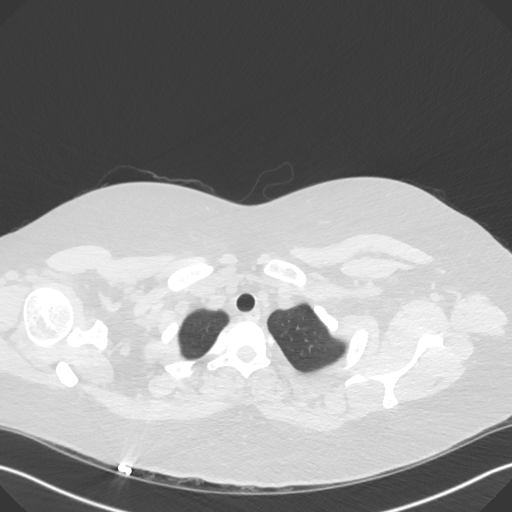
[im 379/397  lung]
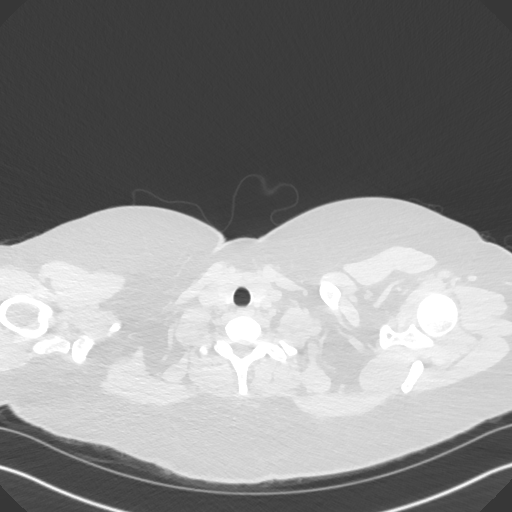

[15 of 36 positions shown; findings below may reference images not displayed]

FINDINGS: Linear areas of scarring in the lung bases. Lungs are otherwise
clear. No pulmonary nodules. No pleural effusions.

Heart is normal size. Aorta is normal caliber. No mediastinal,
hilar, or axillary adenopathy. Chest wall soft tissues are
unremarkable. Imaging into the upper abdomen shows no acute
findings.
IMPRESSION: No acute cardiopulmonary disease.

## 2016-11-11 ENCOUNTER — Emergency Department (HOSPITAL_COMMUNITY): Payer: Self-pay

## 2016-11-11 ENCOUNTER — Emergency Department (HOSPITAL_COMMUNITY)
Admission: EM | Admit: 2016-11-11 | Discharge: 2016-11-11 | Disposition: A | Discharge status: Home / Self Care | Payer: Self-pay | Attending: Emergency Medicine | Admitting: Emergency Medicine

## 2016-11-11 ENCOUNTER — Encounter (HOSPITAL_COMMUNITY): Payer: Self-pay

## 2016-11-11 DIAGNOSIS — N939 Abnormal uterine and vaginal bleeding, unspecified: Secondary | ICD-10-CM

## 2016-11-11 DIAGNOSIS — N760 Acute vaginitis: Secondary | ICD-10-CM | POA: Insufficient documentation

## 2016-11-11 DIAGNOSIS — B9689 Other specified bacterial agents as the cause of diseases classified elsewhere: Secondary | ICD-10-CM | POA: Insufficient documentation

## 2016-11-11 HISTORY — DX: Systemic lupus erythematosus, unspecified: M32.9

## 2016-11-11 HISTORY — DX: Reserved for concepts with insufficient information to code with codable children: IMO0002

## 2016-11-11 LAB — I-STAT BETA HCG BLOOD, ED (MC, WL, AP ONLY)

## 2016-11-11 LAB — URINALYSIS, ROUTINE W REFLEX MICROSCOPIC
BILIRUBIN URINE: NEGATIVE
Glucose, UA: NEGATIVE mg/dL
KETONES UR: 20 mg/dL — AB
LEUKOCYTES UA: NEGATIVE
Nitrite: NEGATIVE
Protein, ur: NEGATIVE mg/dL
Specific Gravity, Urine: 1.025 (ref 1.005–1.030)
pH: 5 (ref 5.0–8.0)

## 2016-11-11 LAB — BASIC METABOLIC PANEL
ANION GAP: 11 (ref 5–15)
BUN: 10 mg/dL (ref 6–20)
CALCIUM: 9.1 mg/dL (ref 8.9–10.3)
CO2: 26 mmol/L (ref 22–32)
Chloride: 103 mmol/L (ref 101–111)
Creatinine, Ser: 0.91 mg/dL (ref 0.44–1.00)
GFR calc Af Amer: >60 mL/min (ref >60)
Glucose, Bld: 137 mg/dL — ABNORMAL HIGH (ref 65–99)
Potassium: 3.6 mmol/L (ref 3.5–5.1)
SODIUM: 140 mmol/L (ref 135–145)

## 2016-11-11 LAB — WET PREP, GENITAL
Sperm: NONE SEEN
Trich, Wet Prep: NONE SEEN
YEAST WET PREP: NONE SEEN

## 2016-11-11 LAB — CBC WITH DIFFERENTIAL/PLATELET
BASOS PCT: 0 %
Basophils Absolute: 0 10*3/uL (ref 0.0–0.1)
Eosinophils Absolute: 0.1 10*3/uL (ref 0.0–0.7)
Eosinophils Relative: 1 %
HEMATOCRIT: 51.5 % — AB (ref 36.0–46.0)
Hemoglobin: 17.8 g/dL — ABNORMAL HIGH (ref 12.0–15.0)
Lymphocytes Relative: 16 %
Lymphs Abs: 1.3 10*3/uL (ref 0.7–4.0)
MCH: 33.4 pg (ref 26.0–34.0)
MCHC: 34.6 g/dL (ref 30.0–36.0)
MCV: 96.6 fL (ref 78.0–100.0)
MONO ABS: 0.5 10*3/uL (ref 0.1–1.0)
Monocytes Relative: 6 %
NEUTROS ABS: 6 10*3/uL (ref 1.7–7.7)
Neutrophils Relative %: 77 %
PLATELETS: 163 10*3/uL (ref 150–400)
RBC: 5.33 MIL/uL — ABNORMAL HIGH (ref 3.87–5.11)
RDW: 12.9 % (ref 11.5–15.5)
WBC: 7.8 10*3/uL (ref 4.0–10.5)

## 2016-11-11 MED ORDER — METRONIDAZOLE 500 MG PO TABS: 500.0000 mg | ORAL_TABLET | Freq: Two times a day (BID) | ORAL | 0 refills | Status: AC

## 2016-11-11 MED ORDER — NORGESTIM-ETH ESTRAD TRIPHASIC 0.18/0.215/0.25 MG-25 MCG PO TABS: 1.0000 | ORAL_TABLET | Freq: Every day | ORAL | 1 refills | Status: AC

## 2016-11-11 NOTE — Discharge Instructions (Signed)
Please call and schedule a follow-up appointment with OB/GYN and make sure to have your Nexplanon implant removed. You may starting the oral birth control pills tomorrow. Please take one tablet every day.   If you develop new or worsening symptoms, including shortness of breath, fatigue, or worsening bleeding, please return to the emergency department for reevaluation.

## 2016-11-11 NOTE — ED Notes (Signed)
Vital signs delayed due to US being in room.

## 2016-11-11 NOTE — ED Provider Notes (Signed)
[facility]-EMERGENCY DEPT Provider Note   CSN: [id number] Arrival date & time: 11/11/16  1407     History   Chief Complaint Chief Complaint  Patient presents with  . Vaginal Bleeding    HPI Jill Ryan is a 26 y.o. female who presents to the emergency department with a chief complaint of vaginal bleeding x1 month that has required her to change her pad 3x daily. She reports associated intermittent, sharp vaginal pain that will resolve spontaneously. She also complains of intermittent chills, myalgias, subjective fever, and dysuria. She denies abdominal pain, fatigue, chest pain,dyspnea, and dyspareunia.   She reports she has the Nexplanon implanted in her arm >3 years and should have already had it removed. Prior to this month, she reports her periods would last for 4 days.  She reports she is currently active with one female partner, her husband. Last intercourse was last night.   The history is provided by the patient. No language interpreter was used.    Past Medical History:  Diagnosis Date  . Lupus     Patient Active Problem List   Diagnosis Date Noted  . Sarcoid   . Vision loss of right eye   . Intractable chronic migraine without aura and without status migrainosus   . Muscle spasms of neck   . 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 CHOLECYSTECTOMY      OB History    No data available       Home Medications    Prior to Admission medications   Medication Sig Start Date End Date Taking? Authorizing Provider  acetaminophen (TYLENOL) 500 MG tablet Take 1,000 mg by mouth every 6 (six) hours as needed for mild pain or moderate pain.   Yes [provider]  buPROPion (WELLBUTRIN XL) 150 MG 24 hr tablet Take 150 mg by mouth daily. 06/13/16 06/13/17 Yes [provider]  ibuprofen (ADVIL,MOTRIN) 200 MG tablet Take 200-600 mg by mouth every 6 (six) hours as needed for mild pain or moderate pain.   Yes  [provider]  metroNIDAZOLE (FLAGYL) 500 MG tablet Take 1 tablet (500 mg total) by mouth 2 (two) times daily. 11/11/16   Eiza Canniff A, PA-C  Norgestimate-Ethinyl Estradiol Triphasic (ORTHO TRI-CYCLEN LO) 0.18/0.215/0.25 MG-25 MCG tab Take 1 tablet by mouth daily. 11/11/16   Ahtziry Saathoff, Coral Else, PA-C    Family History History reviewed. No pertinent family history.  Social History Social History  Substance Use Topics  . Smoking status: Current Every Day Smoker  . Smokeless tobacco: Never Used  . Alcohol use Yes     Allergies   Patient has no known allergies.   Review of Systems Review of Systems  Constitutional: Positive for chills and fever. Negative for fatigue.  Respiratory: Negative for shortness of breath.   Gastrointestinal: Negative for abdominal pain.  Genitourinary: Positive for dysuria, menstrual problem, vaginal bleeding and vaginal pain. Negative for dyspareunia and pelvic pain.  Musculoskeletal: Positive for back pain and myalgias. Negative for gait problem.  Neurological: Negative for headaches.   Physical Exam Updated Vital Signs BP (!) 128/92 (BP Location: Right Arm)   Pulse 77   Temp 98.2 F (36.8 C) (Oral)   Resp 14   Ht 5' (1.524 m)   Wt 72.1 kg (159 lb)   SpO2 95%   BMI 31.05 kg/m   Physical Exam  Constitutional: No distress.  HENT:  Head: Normocephalic.  Eyes: Conjunctivae are normal.  Neck:  Neck supple.  Cardiovascular: Normal rate, regular rhythm and normal heart sounds.  Exam reveals no gallop and no friction rub.   No murmur heard. Pulmonary/Chest: Effort normal. No respiratory distress.  Abdominal: Soft. She exhibits no distension. There is tenderness. There is no rebound and no guarding.  Mild suprapubic tenderness.   Genitourinary:  Genitourinary Comments: Gross blood noted in the vaginal vault. No CMT. No adnexal tenderness.   Musculoskeletal: Normal range of motion. She exhibits no edema, tenderness or deformity.    Neurological: She is alert.  Skin: Skin is warm. No rash noted.  Psychiatric: Her behavior is normal.  Nursing note and vitals reviewed.  ED Treatments / Results  Labs (all labs ordered are listed, but only abnormal results are displayed) Labs Reviewed  WET PREP, GENITAL - Abnormal; Notable for the following:       Result Value   Clue Cells Wet Prep HPF POC PRESENT (*)    WBC, Wet Prep HPF POC FEW (*)    All other components within normal limits  CBC WITH DIFFERENTIAL/PLATELET - Abnormal; Notable for the following:    RBC 5.33 (*)    Hemoglobin 17.8 (*)    HCT 51.5 (*)    All other components within normal limits  BASIC METABOLIC PANEL - Abnormal; Notable for the following:    Glucose, Bld 137 (*)    All other components within normal limits  URINALYSIS, ROUTINE W REFLEX MICROSCOPIC - Abnormal; Notable for the following:    Color, Urine AMBER (*)    Hgb urine dipstick LARGE (*)    Ketones, ur 20 (*)    Bacteria, UA RARE (*)    Squamous Epithelial / LPF 0-5 (*)    All other components within normal limits  I-STAT BETA HCG BLOOD, ED ([facility], [facility], [facility] ONLY)  GC/CHLAMYDIA PROBE AMP ([place]) NOT AT [facility]    EKG  EKG Interpretation None       Radiology [place] Transvaginal Non-ob  Result Date: 11/11/2016 CLINICAL DATA:  Initial evaluation for vaginal bleeding for 1 month. History of lupus. EXAM: TRANSABDOMINAL AND TRANSVAGINAL ULTRASOUND OF PELVIS TECHNIQUE: Both transabdominal and transvaginal ultrasound examinations of the pelvis were performed. Transabdominal technique was performed for global imaging of the pelvis including uterus, ovaries, adnexal regions, and pelvic cul-de-sac. It was necessary to proceed with endovaginal exam following the transabdominal exam to visualize the uterus and ovaries. COMPARISON:  None FINDINGS: Uterus Measurements: 8.3 x 4.4 x 5.2 cm. No fibroids or other mass visualized. Endometrium Thickness: 4 mm.  No focal abnormality visualized. Right ovary  Measurements: 3.4 x 1.6 x 1.5 cm. Normal appearance/no adnexal mass. Left ovary Measurements: 2.7 x 2.0 x 2.0 cm. Normal appearance/no adnexal mass. Other findings No abnormal free fluid. IMPRESSION: Normal pelvic ultrasound.  No acute abnormality identified. Electronically Signed   By: Rise MuBenjamin  McClintock M.D.   On: 11/11/2016 21:34   [place] Pelvis Complete  Result Date: 11/11/2016 CLINICAL DATA:  Initial evaluation for vaginal bleeding for 1 month. History of lupus. EXAM: TRANSABDOMINAL AND TRANSVAGINAL ULTRASOUND OF PELVIS TECHNIQUE: Both transabdominal and transvaginal ultrasound examinations of the pelvis were performed. Transabdominal technique was performed for global imaging of the pelvis including uterus, ovaries, adnexal regions, and pelvic cul-de-sac. It was necessary to proceed with endovaginal exam following the transabdominal exam to visualize the uterus and ovaries. COMPARISON:  None FINDINGS: Uterus Measurements: 8.3 x 4.4 x 5.2 cm. No fibroids or other mass visualized. Endometrium Thickness: 4 mm.  No focal abnormality visualized. Right  ovary Measurements: 3.4 x 1.6 x 1.5 cm. Normal appearance/no adnexal mass. Left ovary Measurements: 2.7 x 2.0 x 2.0 cm. Normal appearance/no adnexal mass. Other findings No abnormal free fluid. IMPRESSION: Normal pelvic ultrasound.  No acute abnormality identified. Electronically Signed   By: Rise Mu M.D.   On: 11/11/2016 21:34    Procedures Procedures (including critical care time)  Medications Ordered in ED Medications - No data to display   Initial Impression / Assessment and Plan / ED Course  I have reviewed the triage vital signs and the nursing notes.  Pertinent labs & imaging results that were available during my care of the patient were reviewed by me and considered in my medical decision making (see chart for details).     26 year old female presenting with vaginal bleeding. Afebrile. Hgb elevated at 17.8 on exam Hct 51.5. No  pallor, dyspnea, chest pain, or lower extremity swelling or pain. The patient reports she has a Nexplanon inserted in her left upper arm that previously supposed to be removed. She reports that she was previously admitted in 2016 for optic neuritis and was diagnosed with lupus; however her medical record indicates she was diagnosed with sarcoid. Transvaginal US ordered to assess for extra-pulmonary manifestations of sarcoid, which include uterine fibroids, which could potentially be the source of the patient's AUB. However, [place] was unremarkable. Wet prep positive for bacterial vaginosis. Will tx the patient for BV.Gc/chlamydia pending. We'll provide the patient with a referral to OB/GYN for further workup of AUB and Nexplanon removal. Strict return precautions given. No acute distress. The patient is safe for discharge at this time.  Final Clinical Impressions(s) / ED Diagnoses   Final diagnoses:  Vaginal bleeding  Bacterial vaginosis    New Prescriptions Discharge Medication List as of 11/11/2016 10:47 PM    START taking these medications   Details  metroNIDAZOLE (FLAGYL) 500 MG tablet Take 1 tablet (500 mg total) by mouth 2 (two) times daily., Starting Fri 11/11/2016, Print    Norgestimate-Ethinyl Estradiol Triphasic (ORTHO TRI-CYCLEN LO) 0.18/0.215/0.25 MG-25 MCG tab Take 1 tablet by mouth daily., Starting Fri 11/11/2016, Print         Macio Kissoon A, PA-C 11/12/16 2130    Loren Racer, MD 11/13/16 1930

## 2016-11-11 NOTE — ED Triage Notes (Signed)
Per EMS, pt from home.  Pt c/o vaginal bleeding x 1 month.  Not pregnant.  Pt states pain all over but has hx of lupus.  Vitals: 140/80, hr 94, resp 18,

## 2016-11-14 LAB — GC/CHLAMYDIA PROBE AMP (~~LOC~~) NOT AT ARMC
CHLAMYDIA, DNA PROBE: NEGATIVE
Neisseria Gonorrhea: NEGATIVE

## 2017-09-11 IMAGING — US US PELVIS COMPLETE
1 series · 14 of 25 positions shown · non-contrast
Comparison: None

CLINICAL DATA: Initial evaluation for vaginal bleeding for 1 month.
History of lupus.

EXAM:
TRANSABDOMINAL AND TRANSVAGINAL ULTRASOUND OF PELVIS
TECHNIQUE: Both transabdominal and transvaginal ultrasound examinations of the
pelvis were performed. Transabdominal technique was performed for
global imaging of the pelvis including uterus, ovaries, adnexal
regions, and pelvic cul-de-sac. It was necessary to proceed with
endovaginal exam following the transabdominal exam to visualize the
uterus and ovaries.

[Series 1: us pelvis complete · 0.22mm/px · 14 of 97 slices shown]
[im 1/97]
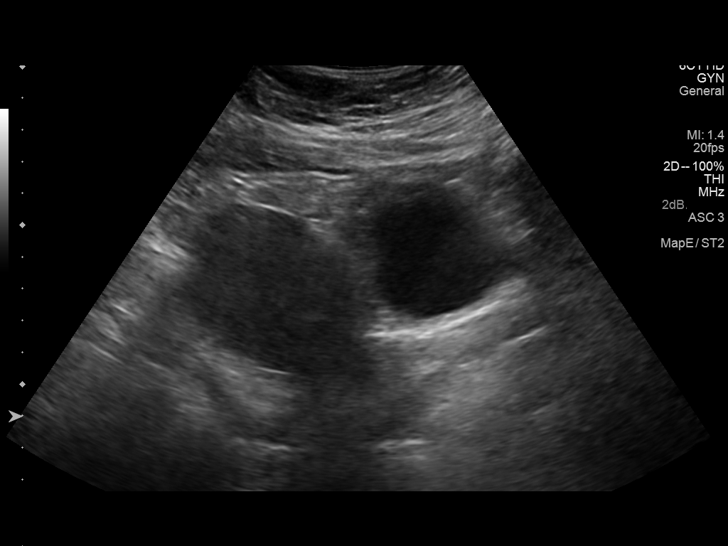
[im 9/97]
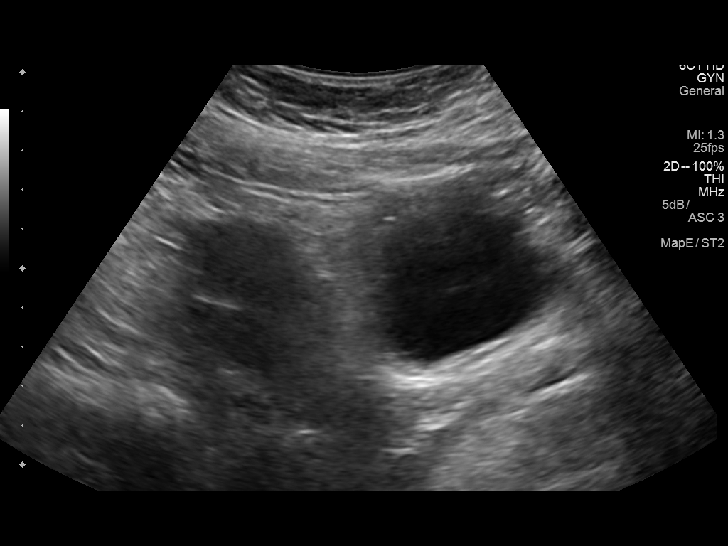
[im 17/97]
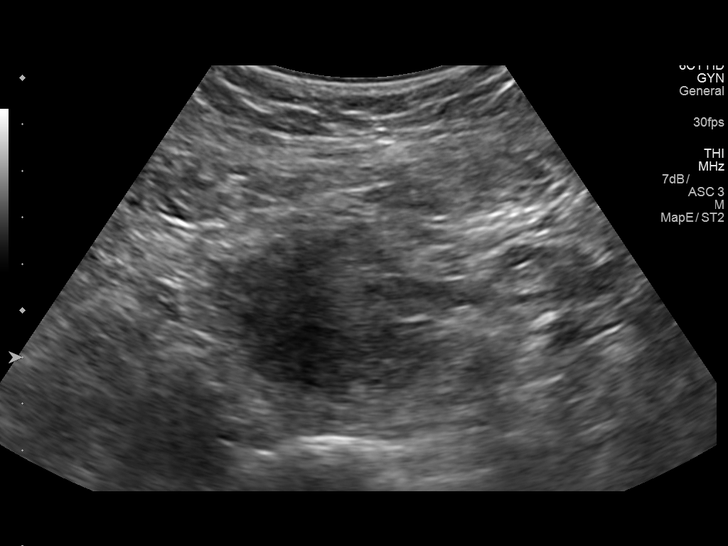
[im 25/97]
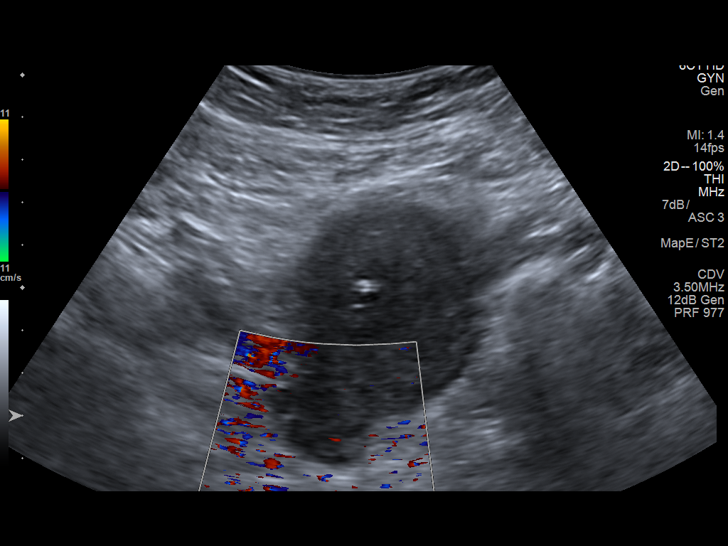
[im 33/97]
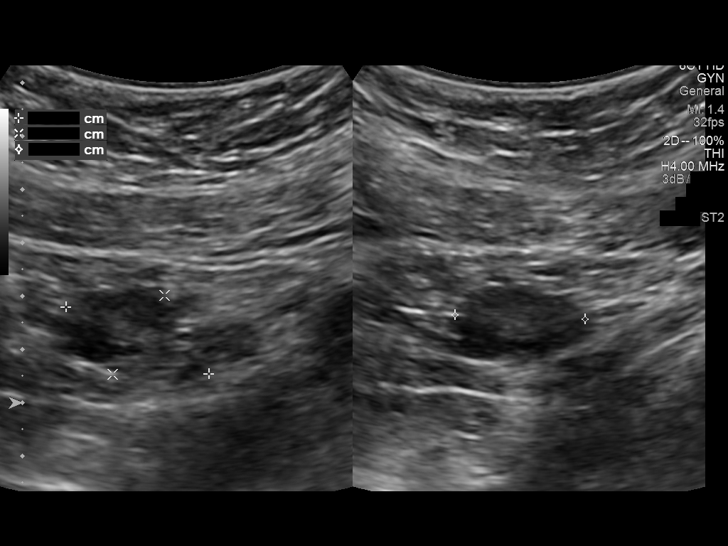
[im 37/97]
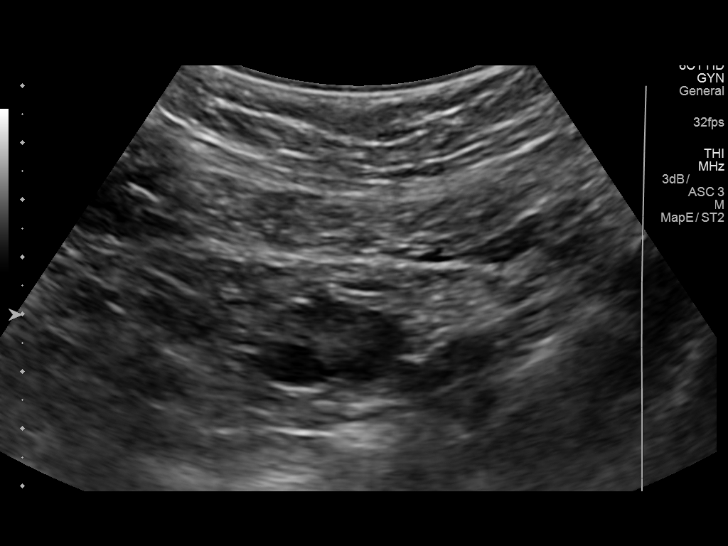
[im 45/97]
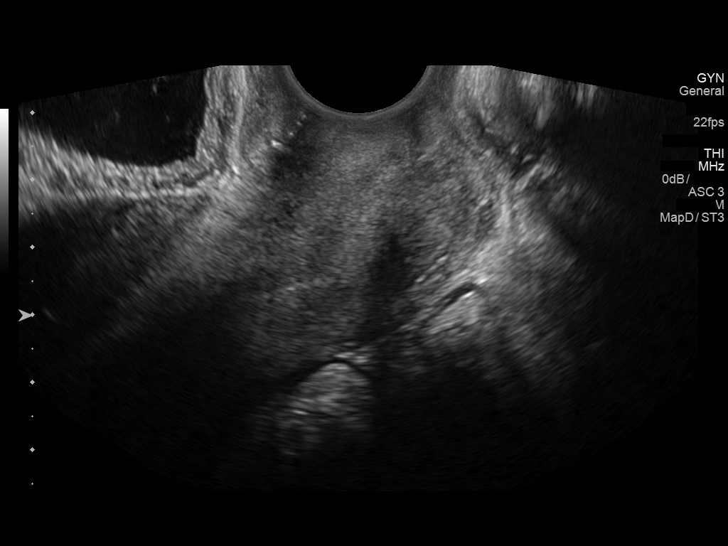
[im 53/97]
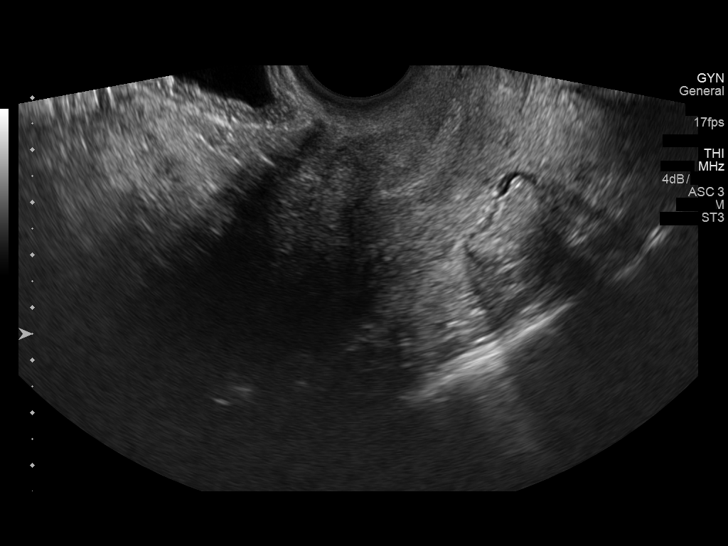
[im 61/97]
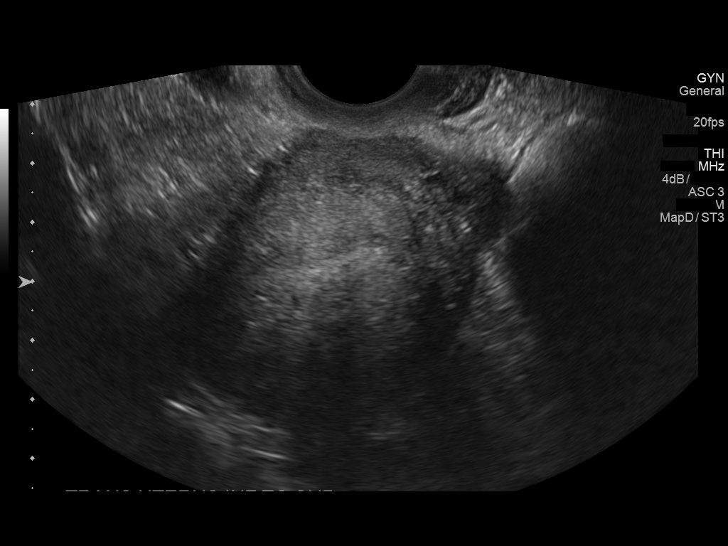
[im 65/97]
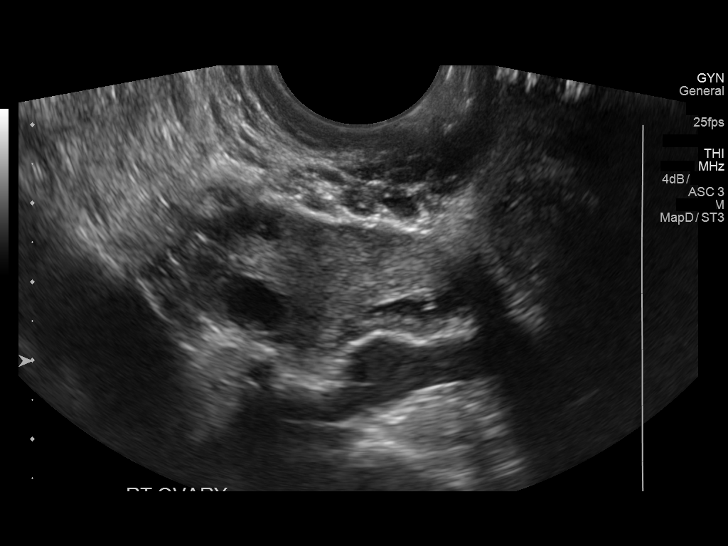
[im 73/97]
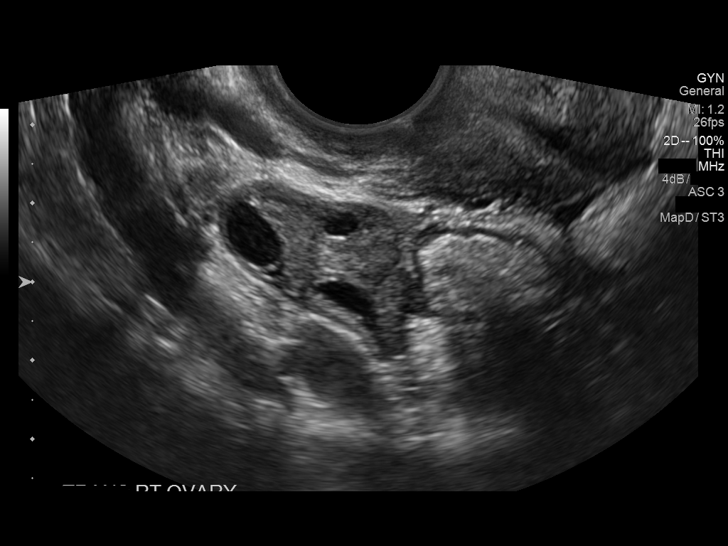
[im 81/97]
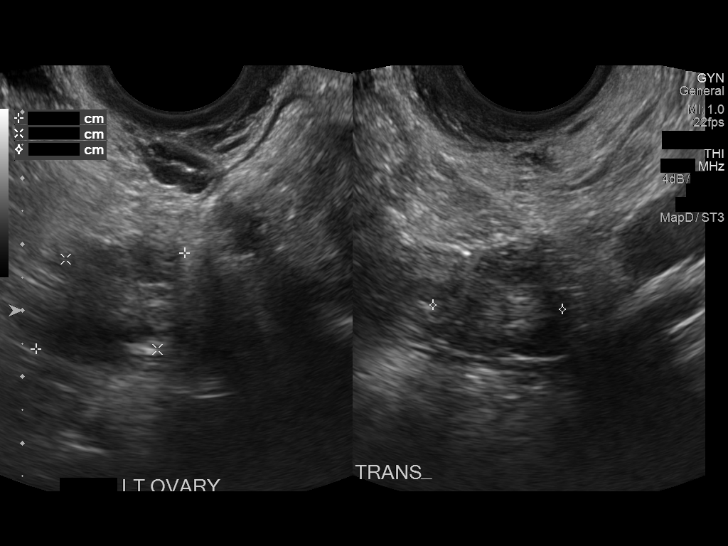
[im 89/97]
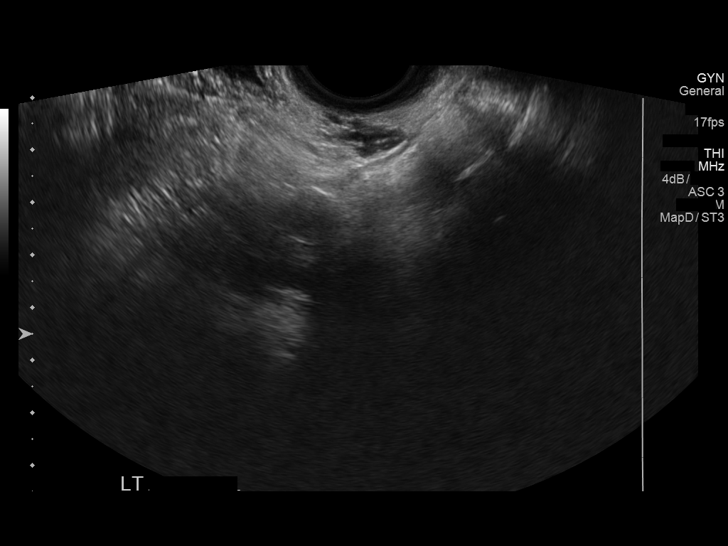
[im 97/97]
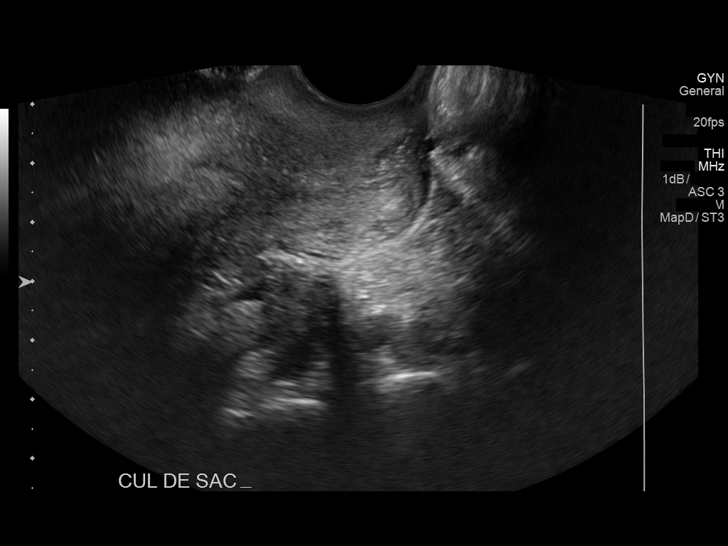

[14 of 25 positions shown; findings below may reference images not displayed]

FINDINGS: Uterus

Measurements: 8.3 x 4.4 x 5.2 cm. No fibroids or other mass
visualized.

Endometrium

Thickness: 4 mm.  No focal abnormality visualized.

Right ovary

Measurements: 3.4 x 1.6 x 1.5 cm. Normal appearance/no adnexal mass.

Left ovary

Measurements: 2.7 x 2.0 x 2.0 cm. Normal appearance/no adnexal mass.

Other findings

No abnormal free fluid.
IMPRESSION: Normal pelvic ultrasound.  No acute abnormality identified.

## 2024-02-22 ENCOUNTER — Ambulatory Visit
